# Patient Record
Sex: Female | Born: 1955 | Race: White | Hispanic: No | Marital: Married | Smoking: Former smoker
Health system: Southern US, Community
[De-identification: ages and names within clinical notes are randomized; demographics above are authoritative.]

## PROBLEM LIST (undated history)

## (undated) DIAGNOSIS — B019 Varicella without complication: Secondary | ICD-10-CM

## (undated) HISTORY — DX: Varicella without complication: B01.9

---

## 1997-10-02 ENCOUNTER — Inpatient Hospital Stay (HOSPITAL_COMMUNITY): Admission: AD | Admit: 1997-10-02 | Discharge: 1997-10-02 | Payer: Self-pay | Admitting: *Deleted

## 1997-10-03 ENCOUNTER — Inpatient Hospital Stay (HOSPITAL_COMMUNITY): Admission: AD | Admit: 1997-10-03 | Discharge: 1997-10-05 | Payer: Self-pay | Admitting: Obstetrics and Gynecology

## 1999-02-27 ENCOUNTER — Other Ambulatory Visit: Admission: RE | Admit: 1999-02-27 | Discharge: 1999-02-27 | Payer: Self-pay | Admitting: Obstetrics and Gynecology

## 2000-06-12 ENCOUNTER — Other Ambulatory Visit: Admission: RE | Admit: 2000-06-12 | Discharge: 2000-06-12 | Payer: Self-pay | Admitting: Obstetrics and Gynecology

## 2001-06-23 ENCOUNTER — Other Ambulatory Visit: Admission: RE | Admit: 2001-06-23 | Discharge: 2001-06-23 | Payer: Self-pay | Admitting: Obstetrics and Gynecology

## 2001-10-21 ENCOUNTER — Encounter: Payer: Self-pay | Admitting: Obstetrics and Gynecology

## 2001-10-21 ENCOUNTER — Encounter: Admission: RE | Admit: 2001-10-21 | Discharge: 2001-10-21 | Payer: Self-pay | Admitting: Obstetrics and Gynecology

## 2002-06-29 ENCOUNTER — Other Ambulatory Visit: Admission: RE | Admit: 2002-06-29 | Discharge: 2002-06-29 | Payer: Self-pay | Admitting: Obstetrics and Gynecology

## 2003-03-23 ENCOUNTER — Encounter: Admission: RE | Admit: 2003-03-23 | Discharge: 2003-03-23 | Payer: Self-pay | Admitting: Obstetrics and Gynecology

## 2003-03-23 ENCOUNTER — Encounter: Payer: Self-pay | Admitting: Obstetrics and Gynecology

## 2003-08-05 ENCOUNTER — Other Ambulatory Visit: Admission: RE | Admit: 2003-08-05 | Discharge: 2003-08-05 | Payer: Self-pay | Admitting: Obstetrics and Gynecology

## 2004-08-08 ENCOUNTER — Encounter: Admission: RE | Admit: 2004-08-08 | Discharge: 2004-08-08 | Payer: Self-pay | Admitting: Obstetrics and Gynecology

## 2005-09-26 ENCOUNTER — Encounter: Admission: RE | Admit: 2005-09-26 | Discharge: 2005-09-26 | Payer: Self-pay | Admitting: Obstetrics and Gynecology

## 2005-10-16 ENCOUNTER — Encounter: Admission: RE | Admit: 2005-10-16 | Discharge: 2005-10-16 | Payer: Self-pay | Admitting: Obstetrics and Gynecology

## 2006-10-10 ENCOUNTER — Encounter: Admission: RE | Admit: 2006-10-10 | Discharge: 2006-10-10 | Payer: Self-pay | Admitting: Obstetrics and Gynecology

## 2007-10-30 ENCOUNTER — Encounter: Admission: RE | Admit: 2007-10-30 | Discharge: 2007-10-30 | Payer: Self-pay | Admitting: Obstetrics and Gynecology

## 2008-11-03 ENCOUNTER — Encounter: Admission: RE | Admit: 2008-11-03 | Discharge: 2008-11-03 | Payer: Self-pay | Admitting: Obstetrics and Gynecology

## 2009-11-10 ENCOUNTER — Encounter: Admission: RE | Admit: 2009-11-10 | Discharge: 2009-11-10 | Payer: Self-pay | Admitting: Obstetrics and Gynecology

## 2010-04-21 ENCOUNTER — Encounter (INDEPENDENT_AMBULATORY_CARE_PROVIDER_SITE_OTHER): Payer: Self-pay

## 2010-04-25 ENCOUNTER — Ambulatory Visit: Payer: Self-pay | Admitting: Internal Medicine

## 2010-09-17 ENCOUNTER — Encounter: Payer: Self-pay | Admitting: Obstetrics and Gynecology

## 2010-09-26 NOTE — Miscellaneous (Signed)
Summary: Lec previsit  Clinical Lists Changes  Medications: Added new medication of MOVIPREP 100 GM  SOLR (PEG-KCL-NACL-NASULF-NA ASC-C) As per prep instructions. - Signed Rx of MOVIPREP 100 GM  SOLR (PEG-KCL-NACL-NASULF-NA ASC-C) As per prep instructions.;  #1 x 0;  Signed;  Entered by: Ulis Rias RN;  Authorized by: Hilarie Fredrickson MD;  Method used: Electronically to [facility]  [address] [phone]*, [address], [place], [place]  [address], Ph: [phone] or [phone], Fax: [phone] Observations: Added new observation of NKA: T (04/25/2010 16:15)    Prescriptions: MOVIPREP 100 GM  SOLR (PEG-KCL-NACL-NASULF-NA ASC-C) As per prep instructions.  #1 x 0   Entered by:   Ulis Rias RN   Authorized by:   Hilarie Fredrickson MD   Signed by:   Ulis Rias RN on 04/25/2010   Method used:   [facility]ronically to        [facility]  [phone]* (retail)       [address]       [place], [place]  [address]       Ph: [phone] or [phone]       Fax: [phone]   RxID:   [id number]

## 2010-09-26 NOTE — Letter (Signed)
Summary: [facility] Instructions  [facility]  [address] [place], [place] [address]   Phone: [phone]  Fax: [phone]       Jacqueline Huang    1955-12-09    MRN: [id number]        Procedure Day /Date:  Thursday 05/04/2010     Arrival Time: 9:30 am      Procedure Time: 10:30 am     Location of Procedure:                    _ x_  [facility] (4th Floor)                        PREPARATION FOR COLONOSCOPY WITH MOVIPREP   Starting 5 days prior to your procedure Saturday 9/3 do not eat nuts, seeds, popcorn, corn, beans, peas,  salads, or any raw vegetables.  Do not take any fiber supplements (e.g. Metamucil, Citrucel, and Benefiber).  THE DAY BEFORE YOUR PROCEDURE         DATE: Wednesday 9/7  1.  Drink clear liquids the entire day-NO SOLID FOOD  2.  Do not drink anything colored red or purple.  Avoid juices with pulp.  No orange juice.  3.  Drink at least 64 oz. (8 glasses) of fluid/clear liquids during the day to prevent dehydration and help the prep work efficiently.  CLEAR LIQUIDS INCLUDE: Water Jello Ice Popsicles Tea (sugar ok, no milk/cream) Powdered fruit flavored drinks Coffee (sugar ok, no milk/cream) Gatorade Juice: apple, white grape, white cranberry  Lemonade Clear bullion, consomm, broth Carbonated beverages (any kind) Strained chicken noodle soup Hard Candy                             4.  In the morning, mix first dose of MoviPrep solution:    Empty 1 Pouch A and 1 Pouch B into the disposable container    Add lukewarm drinking water to the top line of the container. Mix to dissolve    Refrigerate (mixed solution should be used within 24 hrs)  5.  Begin drinking the prep at 5:00 p.m. The MoviPrep container is divided by 4 marks.   Every 15 minutes drink the solution down to the next mark (approximately 8 oz) until the full liter is complete.   6.  Follow completed prep with 16 oz of clear liquid of your choice  (Nothing red or purple).  Continue to drink clear liquids until bedtime.  7.  Before going to bed, mix second dose of MoviPrep solution:    Empty 1 Pouch A and 1 Pouch B into the disposable container    Add lukewarm drinking water to the top line of the container. Mix to dissolve    Refrigerate  THE DAY OF YOUR PROCEDURE      DATE: Thursday 9/8  Beginning at 5:30 a.m. (5 hours before procedure):         1. Every 15 minutes, drink the solution down to the next mark (approx 8 oz) until the full liter is complete.  2. Follow completed prep with 16 oz. of clear liquid of your choice.    3. You may drink clear liquids until 8:30 am (2 HOURS BEFORE PROCEDURE).   MEDICATION INSTRUCTIONS  Unless otherwise instructed, you should take regular prescription medications with a small sip of water   as early as possible the morning of  your procedure.         OTHER INSTRUCTIONS  You will need a responsible adult at least 53 years of age to accompany you and drive you home.   This person must remain in the waiting room during your procedure.  Wear loose fitting clothing that is easily removed.  Leave jewelry and other valuables at home.  However, you may wish to bring a book to read or  an iPod/MP3 player to listen to music as you wait for your procedure to start.  Remove all body piercing jewelry and leave at home.  Total time from sign-in until discharge is approximately 2-3 hours.  You should go home directly after your procedure and rest.  You can resume normal activities the  day after your procedure.  The day of your procedure you should not:   Drive   Make legal decisions   Operate machinery   Drink alcohol   Return to work  You will receive specific instructions about eating, activities and medications before you leave.    The above instructions have been reviewed and explained to me by   Ulis Rias RN  April 25, 2010 5:08 PM     I fully understand and  can verbalize these instructions _____________________________ Date _________

## 2010-11-08 ENCOUNTER — Other Ambulatory Visit: Payer: Self-pay | Admitting: Obstetrics and Gynecology

## 2010-11-08 DIAGNOSIS — Z1231 Encounter for screening mammogram for malignant neoplasm of breast: Secondary | ICD-10-CM

## 2010-11-15 ENCOUNTER — Ambulatory Visit
Admission: RE | Admit: 2010-11-15 | Discharge: 2010-11-15 | Disposition: A | Discharge status: Home / Self Care | Payer: BC Managed Care – PPO | Source: Ambulatory Visit | Attending: Obstetrics and Gynecology | Admitting: Obstetrics and Gynecology

## 2010-11-15 DIAGNOSIS — Z1231 Encounter for screening mammogram for malignant neoplasm of breast: Secondary | ICD-10-CM

## 2011-11-13 ENCOUNTER — Other Ambulatory Visit: Payer: Self-pay | Admitting: Obstetrics and Gynecology

## 2011-11-13 DIAGNOSIS — Z1231 Encounter for screening mammogram for malignant neoplasm of breast: Secondary | ICD-10-CM

## 2011-11-20 ENCOUNTER — Ambulatory Visit
Admission: RE | Admit: 2011-11-20 | Discharge: 2011-11-20 | Disposition: A | Discharge status: Home / Self Care | Payer: BC Managed Care – PPO | Source: Ambulatory Visit | Attending: Obstetrics and Gynecology | Admitting: Obstetrics and Gynecology

## 2011-11-20 DIAGNOSIS — Z1231 Encounter for screening mammogram for malignant neoplasm of breast: Secondary | ICD-10-CM

## 2012-03-09 ENCOUNTER — Ambulatory Visit (INDEPENDENT_AMBULATORY_CARE_PROVIDER_SITE_OTHER): Payer: BC Managed Care – PPO | Admitting: Family Medicine

## 2012-03-09 VITALS — BP 118/78 | HR 80 | Temp 98.6°F | Resp 16 | Ht 68.5 in | Wt 178.8 lb

## 2012-03-09 DIAGNOSIS — B029 Zoster without complications: Secondary | ICD-10-CM

## 2012-03-09 MED ORDER — VALACYCLOVIR HCL 1 G PO TABS: 1000.0000 mg | ORAL_TABLET | Freq: Three times a day (TID) | ORAL | Status: DC

## 2012-03-09 MED ORDER — HYDROCODONE-ACETAMINOPHEN 5-500 MG PO TABS: 1.0000 | ORAL_TABLET | Freq: Three times a day (TID) | ORAL | Status: AC | PRN

## 2012-03-09 NOTE — Patient Instructions (Signed)
To look up more info on your condition, go to the website [email or web address], then on patient resources - select UPTODATE. Under patient resources, select Shingles Return to the clinic or go to the nearest emergency room if any of your symptoms worsen or new symptoms occur.    Shingles Shingles is caused by the same virus that causes chickenpox (varicella zoster virus or VZV). Shingles often occurs many years or decades after having chickenpox. That is why it is more common in adults older than 50 years. The virus reactivates and breaks out as an infection in a nerve root. SYMPTOMS   The initial feeling (sensations) may be pain. This pain is usually described as:   Burning.   Stabbing.   Throbbing.   Tingling in the nerve root.   A red rash will follow in a couple days. The rash may occur in any area of the body and is usually on one side (unilateral) of the body in a band or belt-like pattern. The rash usually starts out as very small blisters (vesicles). They will dry up after 7 to 10 days. This is not usually a significant problem except for the pain it causes.   Long-lasting (chronic) pain is more likely in an elderly person. It can last months to years. This condition is called postherpetic neuralgia.  Shingles can be an extremely severe infection in someone with AIDS, a weakened immune system, or with forms of leukemia. It can also be severe if you are taking transplant medicines or other medicines that weaken the immune system. TREATMENT  Your caregiver will often treat you with:  Antiviral drugs.   Anti-inflammatory drugs.   Pain medicines.  Bed rest is very important in preventing the pain associated with herpes zoster (postherpetic neuralgia). Application of heat in the form of a hot water bottle or electric heating pad or gentle pressure with the hand is recommended to help with the pain or discomfort. PREVENTION  A varicella zoster vaccine is available to help protect against  the virus. The Food and Drug Administration approved the varicella zoster vaccine for individuals 32 years of age and older. HOME CARE INSTRUCTIONS   Cool compresses to the area of rash may be helpful.   Only take over-the-counter or prescription medicines for pain, discomfort, or fever as directed by your caregiver.   Avoid contact with:   Babies.   Pregnant women.   Children with eczema.   Elderly people with transplants.   People with chronic illnesses, such as leukemia and AIDS.   If the area involved is on your face, you may receive a referral for follow-up to a specialist. It is very important to keep all follow-up appointments. This will help avoid eye complications, chronic pain, or disability.  SEEK IMMEDIATE MEDICAL CARE IF:   You develop any pain (headache) in the area of the face or eye. This must be followed carefully by your caregiver or ophthalmologist. An infection in part of your eye (cornea) can be very serious. It could lead to blindness.   You do not have pain relief from prescribed medicines.   Your redness or swelling spreads.   The area involved becomes very swollen and painful.   You have a fever.   You notice any red or painful lines extending away from the affected area toward your heart (lymphangitis).   Your condition is worsening or has changed.  Document Released: 08/13/2005 Document Revised: 08/02/2011 Document Reviewed: 07/18/2009 [facility] Patient Information 2012 [place], [place].

## 2012-03-09 NOTE — Progress Notes (Signed)
  Subjective:    Patient ID: REN GRASSE, female    DOB: 06/13/1956, 56 y.o.   MRN: [id number]  HPI ADA HOLNESS is a 56 y.o. female Rash - R back around side to breast.  Itchy, painful.  Soreness/ache - last week - but noticed a rash 2 nights ago.  Slight nausea with the pain.    Tx: calamine, advil.     Review of Systems As above.  No fever.  Rash only on R side/.     Objective:   Physical Exam  Constitutional: She is oriented to person, place, and time. She appears well-developed and well-nourished.  HENT:  Head: Normocephalic.  Pulmonary/Chest: Effort normal.  Neurological: She is alert and oriented to person, place, and time.  Skin: Skin is warm and dry. Rash noted. Rash is vesicular.          vesicular - coalescent erythematous rash in T4 distribution on R .   Psychiatric: She has a normal mood and affect. Her behavior is normal.      Assessment & Plan:  TEKOA HAMOR is a 56 y.o. female

## 2012-11-13 ENCOUNTER — Other Ambulatory Visit: Payer: Self-pay

## 2012-11-13 ENCOUNTER — Other Ambulatory Visit: Payer: Self-pay | Admitting: Obstetrics and Gynecology

## 2012-12-15 ENCOUNTER — Ambulatory Visit
Admission: RE | Admit: 2012-12-15 | Discharge: 2012-12-15 | Disposition: A | Discharge status: Home / Self Care | Payer: BC Managed Care – PPO | Source: Ambulatory Visit

## 2012-12-15 DIAGNOSIS — Z1231 Encounter for screening mammogram for malignant neoplasm of breast: Secondary | ICD-10-CM

## 2013-03-05 ENCOUNTER — Ambulatory Visit (INDEPENDENT_AMBULATORY_CARE_PROVIDER_SITE_OTHER): Payer: BC Managed Care – PPO | Admitting: Family Medicine

## 2013-03-05 ENCOUNTER — Encounter: Payer: Self-pay | Admitting: Family Medicine

## 2013-03-05 VITALS — BP 124/82 | HR 70 | Temp 97.9°F | Ht 68.0 in | Wt 167.0 lb

## 2013-03-05 DIAGNOSIS — L918 Other hypertrophic disorders of the skin: Secondary | ICD-10-CM

## 2013-03-05 DIAGNOSIS — Z23 Encounter for immunization: Secondary | ICD-10-CM

## 2013-03-05 DIAGNOSIS — L909 Atrophic disorder of skin, unspecified: Secondary | ICD-10-CM

## 2013-03-05 DIAGNOSIS — L821 Other seborrheic keratosis: Secondary | ICD-10-CM

## 2013-03-05 NOTE — Patient Instructions (Addendum)
Skin Cancer  Most skin cancers occur on the face, neck, arms, or on sun-exposed areas. Damage from excessive sunlight exposure is the most common cause of skin cancer. People with light skin are more likely to develop skin cancer than people with more skin pigment, but skin cancers occur in people of all races. Squamous and basal cell cancers are the most common types. They are both slow-growing and easy to recognize. They often form scaly open sores that do not heal. Melanoma is a skin cancer that looks like a mole. Most moles are harmless, especially if they do not change over time.  Most skin cancers can be prevented by avoiding excessive sunlight exposure. Signs that a skin sore or mole may be cancerous include:   A sore that does not heal or one that bleeds or slowly gets bigger.   Moles that are growing or are larger than a pencil eraser, or ones that have irregular borders or color variations.   Moles that have a bump, a raised area, or cause itching or pain.  If you have a skin sore or mole that could be cancer, it is important that you call your doctor right away for a skin exam and possible skin biopsy to determine the best treatment.  Document Released: 09/20/2004 Document Revised: 11/05/2011 Document Reviewed: 12/31/2008  [employer] Patient Information 2014 [employer].

## 2013-03-05 NOTE — Progress Notes (Signed)
  Subjective:    Patient ID: Jacqueline Huang, female    DOB: September 14, 1955, 57 y.o.   MRN: [id number]  HPI Here to establish care Been generally very healthy with no chronic medical problems. She sees gynecologist and is on hormone replacement post menopause. No prior surgeries. She did smoke several years ago but quit from 1998.  12 pack year history.  Family history significant for mother dying of melanoma complications. Patient is requesting skin check today. She has multiple seborrheic keratoses and skin tags but no recent changing or concerning lesions.  Last tetanus was 2004. She walks some for exercise.  Past Medical History  Diagnosis Date  . Chicken pox    History reviewed. No pertinent past surgical history.  reports that she has quit smoking. She does not have any smokeless tobacco history on file. Her alcohol and drug histories are not on file. family history includes Cancer in her mother. No Known Allergies     Review of Systems  Constitutional: Negative for fever, activity change, appetite change, fatigue and unexpected weight change.  HENT: Negative for hearing loss, ear pain, sore throat and trouble swallowing.   Eyes: Negative for visual disturbance.  Respiratory: Negative for cough and shortness of breath.   Cardiovascular: Negative for chest pain and palpitations.  Gastrointestinal: Negative for abdominal pain, diarrhea, constipation and blood in stool.  Genitourinary: Negative for dysuria and hematuria.  Musculoskeletal: Negative for myalgias, back pain and arthralgias.  Skin: Negative for rash.  Neurological: Negative for dizziness, syncope and headaches.  Hematological: Negative for adenopathy.  Psychiatric/Behavioral: Negative for confusion and dysphoric mood.       Objective:   Physical Exam  Constitutional: She appears well-developed and well-nourished.  Neck: Neck supple. No thyromegaly present.  Cardiovascular: Normal rate and regular rhythm.    No murmur heard. Pulmonary/Chest: Breath sounds normal. No respiratory distress. She has no wheezes. She has no rales.  Musculoskeletal: She exhibits no edema.  Skin:  Patient's combination of seborrheic keratoses and skin tags mostly involving the trunk. No concerning lesions noted          Assessment & Plan:  Patient has multiple irritated skin tags because of location and seborrheic keratoses. She is reassured these all appear benign. Discussed risk and benefits of treatment with liquid nitrogen and patient consents . We treated four different skin tags left side of neck and right side of neck and four skin tags anterior chest that were irritated because of location.

## 2013-07-02 ENCOUNTER — Other Ambulatory Visit: Payer: Self-pay

## 2013-12-10 ENCOUNTER — Other Ambulatory Visit: Payer: Self-pay

## 2013-12-10 DIAGNOSIS — Z1231 Encounter for screening mammogram for malignant neoplasm of breast: Secondary | ICD-10-CM

## 2013-12-17 ENCOUNTER — Ambulatory Visit (INDEPENDENT_AMBULATORY_CARE_PROVIDER_SITE_OTHER): Payer: BC Managed Care – PPO | Admitting: Family Medicine

## 2013-12-17 ENCOUNTER — Encounter: Payer: Self-pay | Admitting: Family Medicine

## 2013-12-17 ENCOUNTER — Ambulatory Visit
Admission: RE | Admit: 2013-12-17 | Discharge: 2013-12-17 | Disposition: A | Discharge status: Home / Self Care | Payer: BC Managed Care – PPO | Source: Ambulatory Visit

## 2013-12-17 VITALS — BP 130/80 | HR 79 | Temp 97.8°F | Wt 168.0 lb

## 2013-12-17 DIAGNOSIS — K602 Anal fissure, unspecified: Secondary | ICD-10-CM

## 2013-12-17 DIAGNOSIS — Z1231 Encounter for screening mammogram for malignant neoplasm of breast: Secondary | ICD-10-CM

## 2013-12-17 MED ORDER — LORAZEPAM 0.5 MG PO TABS: 0.5000 mg | ORAL_TABLET | Freq: Four times a day (QID) | ORAL | Status: DC | PRN

## 2013-12-17 NOTE — Progress Notes (Addendum)
   Subjective:    Patient ID: Jacqueline PlaterCynthia L Smelser, female    DOB: 10/20/1955, 58 y.o.   MRN: [id number]  HPI Patient seen with concern for possible hemorrhoids. For about 3 weeks she noticed some itching and mild soreness around the anal area. She has not had any change in bowel habits. No blood with wiping. Symptoms of been relatively mild. Has not tried any alleviating factors. She reports colonoscopy 3 years ago normal. No recent appetite or weight changes. No exacerbating factors.  Past Medical History  Diagnosis Date  . Chicken pox    No past surgical history on file.  reports that she has quit smoking. She does not have any smokeless tobacco history on file. Her alcohol and drug histories are not on file. family history includes Cancer in her mother. No Known Allergies    Review of Systems  Constitutional: Negative for appetite change and unexpected weight change.  Gastrointestinal: Negative for vomiting, abdominal pain, diarrhea, constipation and blood in stool.       Objective:   Physical Exam  Constitutional: She appears well-developed and well-nourished.  Cardiovascular: Normal rate and regular rhythm.   Pulmonary/Chest: Effort normal and breath sounds normal. No respiratory distress. She has no wheezes. She has no rales.  Genitourinary:  Patient has anal fissure at 12:00 position. No active bleeding. She does not have any visible external hemorrhoids, nor any external rash or dermatitis.          Assessment & Plan:  Anal fissure. No active bleeding. Minimal discomfort. Recommend sitz baths. Consider either topical nitroglycerin diluted with Vaseline or diltiazem diluted with Vaseline if her pain becomes more prominent. Measures to reduce constipation  Patient requested something for flying. [place] this [place]. wrote limited lorazepam 0.5 mg one every 6 hours as needed for severe anxiety #20 with no refill

## 2013-12-17 NOTE — Patient Instructions (Signed)
Anal Fissure, Adult  An anal fissure is a small tear or crack in the skin around the anus. Bleeding from a fissure usually stops on its own within a few minutes. However, bleeding will often reoccur with each bowel movement until the crack heals.   CAUSES    Passing large, hard stools.   Frequent diarrheal stools.   Constipation.   Inflammatory bowel disease (Crohn's disease or ulcerative colitis).   Infections.   Anal sex.  SYMPTOMS    Small amounts of blood seen on your stools, on toilet paper, or in the toilet after a bowel movement.   Rectal bleeding.   Painful bowel movements.   Itching or irritation around the anus.  DIAGNOSIS  Your caregiver will examine the anal area. An anal fissure can usually be seen with careful inspection. A rectal exam may be performed and a short tube (anoscope) may be used to examine the anal canal.  TREATMENT    You may be instructed to take fiber supplements. These supplements can soften your stool to help make bowel movements easier.   Sitz baths may be recommended to help heal the tear. Do not use soap in the sitz baths.   A medicated cream or ointment may be prescribed to lessen discomfort.  HOME CARE INSTRUCTIONS    Maintain a diet high in fruits, whole grains, and vegetables. Avoid constipating foods like bananas and dairy products.   Take sitz baths as directed by your caregiver.   Drink enough fluids to keep your urine clear or pale yellow.   Only take over-the-counter or prescription medicines for pain, discomfort, or fever as directed by your caregiver. Do not take aspirin as this may increase bleeding.   Do not use ointments containing numbing medications (anesthetics) or hydrocortisone. They could slow healing.  SEEK MEDICAL CARE IF:    Your fissure is not completely healed within 3 days.   You have further bleeding.   You have a fever.   You have diarrhea mixed with blood.   You have pain.   Your problem is getting worse rather than  better.  MAKE SURE YOU:    Understand these instructions.   Will watch your condition.   Will get help right away if you are not doing well or get worse.  Document Released: 08/13/2005 Document Revised: 11/05/2011 Document Reviewed: 01/28/2011  [employer] Patient Information 2014 [employer].

## 2013-12-17 NOTE — Addendum Note (Signed)
Addended by: Kristian CoveyBURCHETTE, Welcome Fults W on: 12/17/2013 10:25 AM   Modules accepted: Orders

## 2013-12-17 NOTE — Progress Notes (Signed)
Pre visit review using our clinic review tool, if applicable. No additional management support is needed unless otherwise documented below in the visit note. 

## 2014-06-11 ENCOUNTER — Other Ambulatory Visit: Payer: Self-pay

## 2014-11-09 ENCOUNTER — Encounter: Payer: Self-pay | Admitting: Family Medicine

## 2014-11-09 ENCOUNTER — Ambulatory Visit (INDEPENDENT_AMBULATORY_CARE_PROVIDER_SITE_OTHER): Payer: BLUE CROSS/BLUE SHIELD | Admitting: Family Medicine

## 2014-11-09 VITALS — BP 124/80 | HR 70 | Temp 97.7°F | Wt 175.0 lb

## 2014-11-09 DIAGNOSIS — L601 Onycholysis: Secondary | ICD-10-CM

## 2014-11-09 NOTE — Progress Notes (Signed)
Pre visit review using our clinic review tool, if applicable. No additional management support is needed unless otherwise documented below in the visit note. 

## 2014-11-09 NOTE — Patient Instructions (Signed)
Onycholysis- nail separation vs onychomycosis Keep foot and area around nail as dry as possible.

## 2014-11-09 NOTE — Progress Notes (Signed)
   Subjective:    Patient ID: Jacqueline Huang, female    DOB: 05/18/1956, 59 y.o.   MRN: [id number]  HPI   Patient seen with nail changes both feet involving great toenails. She's noticed some separation of the nail from the tissue underneath. She's noticed some color changes of both toenails. No pain. No thickened or brittle changes. No hx of diabetes.    Past Medical History  Diagnosis Date  . Chicken pox    No past surgical history on file.  reports that she has quit smoking. She does not have any smokeless tobacco history on file. Her alcohol and drug histories are not on file. family history includes Cancer in her mother. No Known Allergies    Review of Systems  Constitutional: Negative for fever and chills.  Skin: Negative for rash.       Objective:   Physical Exam  Constitutional: She appears well-developed and well-nourished.  Cardiovascular: Normal rate and regular rhythm.   Skin:  Patient has some separation of both great toenails from tissue underneath. Nail itself is non-brittle and appears solid. No surrounding erythema. 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 Onychomycosis less likely. Set up podiatry referral. Keep areas dry as possible.  We discussed risks of superinfection with things like pseudomonas.

## 2014-11-17 ENCOUNTER — Ambulatory Visit (INDEPENDENT_AMBULATORY_CARE_PROVIDER_SITE_OTHER): Payer: BLUE CROSS/BLUE SHIELD | Admitting: Podiatry

## 2014-11-17 ENCOUNTER — Encounter: Payer: Self-pay | Admitting: Podiatry

## 2014-11-17 VITALS — BP 137/87 | HR 93 | Ht 69.0 in | Wt 170.0 lb

## 2014-11-17 DIAGNOSIS — M21969 Unspecified acquired deformity of unspecified lower leg: Secondary | ICD-10-CM | POA: Diagnosis not present

## 2014-11-17 DIAGNOSIS — M2020 Hallux rigidus, unspecified foot: Secondary | ICD-10-CM

## 2014-11-17 DIAGNOSIS — M205X9 Other deformities of toe(s) (acquired), unspecified foot: Secondary | ICD-10-CM

## 2014-11-17 DIAGNOSIS — L609 Nail disorder, unspecified: Secondary | ICD-10-CM

## 2014-11-17 DIAGNOSIS — L608 Other nail disorders: Secondary | ICD-10-CM

## 2014-11-17 NOTE — Progress Notes (Signed)
Subjective: 59 year old female presents stating that both great toe nails have discoloration x 1 year. Last few weeks got worse.  Not on feet much. Has desk job. Does elliptical exercise.   Review of systems are negative.  Objective: Dermatologic: Dark brown discolored toe nails on both great toes. Neurovascular status are within normal. 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 Dorsally enlarged first Metatarsophalangeal joint left. Limited dorsiflexion of the 1st MPJ, 20 degrees on left and 35 degrees on right.   Assessment: Hallux limitus left>right.  Microtrauma to great toe nails bilateral. Metatarsal deformity bilateral.   Plan: Reviewed clinical findings. Reviewed the need for change in shoe gear and custom orthotics.

## 2014-11-17 NOTE — Patient Instructions (Signed)
Seen for discolorated toe nails. Noted of displacing first metatarsal bones, leading to "Hallux limitus" condition and micro trauma to toe nails. Recommend soft toe box shoes and custom orthotics in future.

## 2015-07-14 ENCOUNTER — Encounter (HOSPITAL_BASED_OUTPATIENT_CLINIC_OR_DEPARTMENT_OTHER): Payer: Self-pay | Admitting: *Deleted

## 2015-07-14 ENCOUNTER — Emergency Department (HOSPITAL_BASED_OUTPATIENT_CLINIC_OR_DEPARTMENT_OTHER): Payer: BLUE CROSS/BLUE SHIELD

## 2015-07-14 ENCOUNTER — Emergency Department (HOSPITAL_BASED_OUTPATIENT_CLINIC_OR_DEPARTMENT_OTHER)
Admission: EM | Admit: 2015-07-14 | Discharge: 2015-07-14 | Disposition: A | Discharge status: Home / Self Care | Payer: BLUE CROSS/BLUE SHIELD | Attending: Emergency Medicine | Admitting: Emergency Medicine

## 2015-07-14 DIAGNOSIS — S93402A Sprain of unspecified ligament of left ankle, initial encounter: Secondary | ICD-10-CM | POA: Insufficient documentation

## 2015-07-14 DIAGNOSIS — Y998 Other external cause status: Secondary | ICD-10-CM | POA: Insufficient documentation

## 2015-07-14 DIAGNOSIS — S99912A Unspecified injury of left ankle, initial encounter: Secondary | ICD-10-CM | POA: Diagnosis present

## 2015-07-14 DIAGNOSIS — Z79818 Long term (current) use of other agents affecting estrogen receptors and estrogen levels: Secondary | ICD-10-CM | POA: Insufficient documentation

## 2015-07-14 DIAGNOSIS — Z87891 Personal history of nicotine dependence: Secondary | ICD-10-CM | POA: Diagnosis not present

## 2015-07-14 DIAGNOSIS — Y9289 Other specified places as the place of occurrence of the external cause: Secondary | ICD-10-CM | POA: Insufficient documentation

## 2015-07-14 DIAGNOSIS — Z8619 Personal history of other infectious and parasitic diseases: Secondary | ICD-10-CM | POA: Diagnosis not present

## 2015-07-14 DIAGNOSIS — W5512XA Struck by horse, initial encounter: Secondary | ICD-10-CM | POA: Diagnosis not present

## 2015-07-14 DIAGNOSIS — Y9389 Activity, other specified: Secondary | ICD-10-CM | POA: Insufficient documentation

## 2015-07-14 MED ORDER — DICLOFENAC SODIUM 1 % TD GEL: 2.0000 g | Freq: Four times a day (QID) | TRANSDERMAL | Status: DC

## 2015-07-14 MED ORDER — IBUPROFEN 600 MG PO TABS: 600.0000 mg | ORAL_TABLET | Freq: Four times a day (QID) | ORAL | Status: DC | PRN

## 2015-07-14 NOTE — Discharge Instructions (Signed)
Ankle Sprain  An ankle sprain is an injury to the strong, fibrous tissues (ligaments) that hold the bones of your ankle joint together.   CAUSES  An ankle sprain is usually caused by a fall or by twisting your ankle. Ankle sprains most commonly occur when you step on the outer edge of your foot, and your ankle turns inward. People who participate in sports are more prone to these types of injuries.   SYMPTOMS    Pain in your ankle. The pain may be present at rest or only when you are trying to stand or walk.   Swelling.   Bruising. Bruising may develop immediately or within 1 to 2 days after your injury.   Difficulty standing or walking, particularly when turning corners or changing directions.  DIAGNOSIS   Your caregiver will ask you details about your injury and perform a physical exam of your ankle to determine if you have an ankle sprain. During the physical exam, your caregiver will press on and apply pressure to specific areas of your foot and ankle. Your caregiver will try to move your ankle in certain ways. An X-ray exam may be done to be sure a bone was not broken or a ligament did not separate from one of the bones in your ankle (avulsion fracture).   TREATMENT   Certain types of braces can help stabilize your ankle. Your caregiver can make a recommendation for this. Your caregiver may recommend the use of medicine for pain. If your sprain is severe, your caregiver may refer you to a surgeon who helps to restore function to parts of your skeletal system (orthopedist) or a physical therapist.  HOME CARE INSTRUCTIONS    Apply ice to your injury for 1-2 days or as directed by your caregiver. Applying ice helps to reduce inflammation and pain.    Put ice in a plastic bag.    Place a towel between your skin and the bag.    Leave the ice on for 15-20 minutes at a time, every 2 hours while you are awake.   Only take over-the-counter or prescription medicines for pain, discomfort, or fever as directed by  your caregiver.   Elevate your injured ankle above the level of your heart as much as possible for 2-3 days.   If your caregiver recommends crutches, use them as instructed. Gradually put weight on the affected ankle. Continue to use crutches or a cane until you can walk without feeling pain in your ankle.   If you have a plaster splint, wear the splint as directed by your caregiver. Do not rest it on anything harder than a pillow for the first 24 hours. Do not put weight on it. Do not get it wet. You may take it off to take a shower or bath.   You may have been given an elastic bandage to wear around your ankle to provide support. If the elastic bandage is too tight (you have numbness or tingling in your foot or your foot becomes cold and blue), adjust the bandage to make it comfortable.   If you have an air splint, you may blow more air into it or let air out to make it more comfortable. You may take your splint off at night and before taking a shower or bath. Wiggle your toes in the splint several times per day to decrease swelling.  SEEK MEDICAL CARE IF:    You have rapidly increasing bruising or swelling.   Your toes feel   extremely cold or you lose feeling in your foot.   Your pain is not relieved with medicine.  SEEK IMMEDIATE MEDICAL CARE IF:   Your toes are numb or blue.   You have severe pain that is increasing.  MAKE SURE YOU:    Understand these instructions.   Will watch your condition.   Will get help right away if you are not doing well or get worse.     This information is not intended to replace advice given to you by your health care provider. Make sure you discuss any questions you have with your health care provider.     Document Released: 08/13/2005 Document Revised: 09/03/2014 Document Reviewed: 08/25/2011  Elsevier Interactive Patient Education 2016 [employer]

## 2015-07-14 NOTE — ED Notes (Signed)
Left ankle swelling. She had a minor kick by a horse as she was leading it out for a walk.

## 2015-07-14 NOTE — ED Provider Notes (Signed)
CSN: [id number]     Arrival date & time 07/14/15  1634 History   First MD Initiated Contact with Patient 07/14/15 1648     Chief Complaint  Patient presents with  . Ankle Injury     (Consider location/radiation/quality/duration/timing/severity/associated sxs/prior Treatment) HPI Comments: Kicked by horse while leading it, could walk after incident and rode horse. Began having swelling of lateral malleolus and pain worsening, now can't bear weight without discomfort.   Patient is a 59 y.o. female presenting with lower extremity injury. The history is provided by the patient.  Ankle Injury This is a new problem. The current episode started 3 to 5 hours ago. The problem occurs constantly. The problem has not changed since onset.The symptoms are aggravated by walking. Nothing relieves the symptoms. She has tried a cold compress (NSAIDs) for the symptoms. The treatment provided mild relief.    Past Medical History  Diagnosis Date  . Chicken pox    History reviewed. No pertinent past surgical history. Family History  Problem Relation Age of Onset  . Cancer Mother     melonma    Social History  Substance Use Topics  . Smoking status: Former Games developermoker  . Smokeless tobacco: None  . Alcohol Use: None   OB History    No data available     Review of Systems  All other systems reviewed and are negative.     Allergies  Review of patient's allergies indicates no known allergies.  Home Medications   Prior to Admission medications   Medication Sig Start Date End Date Taking? Authorizing Provider  LORazepam (ATIVAN) 0.5 MG tablet Take 1 tablet (0.5 mg total) by mouth every 6 (six) hours as needed for anxiety. 12/17/13   Kristian CoveyBruce W Burchette, MD  norethindrone-ethinyl estradiol (FEMHRT 1/5) 1-5 MG-MCG TABS Take by mouth daily.    Historical Provider, MD   BP 135/93 mmHg  Pulse 74  Temp(Src) 97.9 F (36.6 C) (Oral)  Resp 18  Ht 5\' 9"  (1.753 m)  Wt 170 lb (77.111 kg)  BMI 25.09 kg/m2   SpO2 100% Physical Exam  Constitutional: She is oriented to person, place, and time. She appears well-developed and well-nourished. No distress.  HENT:  Head: Normocephalic.  Eyes: Conjunctivae are normal.  Neck: Neck supple. No tracheal deviation present.  Cardiovascular: Normal rate and regular rhythm.   Pulmonary/Chest: Effort normal. No respiratory distress.  Abdominal: Soft. She exhibits no distension.  Musculoskeletal:       Left ankle: She exhibits decreased range of motion (2/2 pain) and swelling. She exhibits no ecchymosis, no deformity, no laceration and normal pulse. Tenderness. AITFL tenderness found. No lateral malleolus, no medial malleolus and no posterior TFL tenderness found. Achilles tendon normal.  Neurological: She is alert and oriented to person, place, and time.  Skin: Skin is warm and dry.  Psychiatric: She has a normal mood and affect.    ED Course  Procedures (including critical care time) Labs Review Labs Reviewed - No data to display  Imaging Review Dg Ankle Complete Left  07/14/2015  CLINICAL DATA:  Left ankle swelling, kicked by a horse in the ankle today EXAM: LEFT ANKLE COMPLETE - 3+ VIEW COMPARISON:  None. FINDINGS: Three views of left ankle submitted. No acute fracture or subluxation. Ankle mortise is preserved. Mild lateral soft tissue swelling. IMPRESSION: No acute fracture or subluxation. Mild soft tissue swelling adjacent to lateral malleolus Electronically Signed   By: Natasha MeadLiviu  Pop M.D.   On: 07/14/2015 16:55   I  have personally reviewed and evaluated these images and lab results as part of my medical decision-making.   EKG Interpretation None      MDM   Final diagnoses:  Ankle sprain, left, initial encounter    59 year old female presents with lateral left ankle swelling after being struck by a horse with point tenderness over the ATFL and pain worst with inversion of ankle but with all cardinal directions. Suspect contusion with negative  plain film but possibility of ankle sprain is high given the exacerbating factors of her pain. She has no instability of the ankle joint with confrontation. She is unable to bear weight on it due to pain currently. Given crutches and topical NSAIDs. Pt given instructions for supportive care including NSAIDs, rest, ice, compression, and elevation to help alleviate symptoms. Plan to follow up with PCP as needed and return precautions discussed for worsening or new concerning symptoms.     Lyndal Pulley, MD 07/14/15 Rickey Primus

## 2017-05-16 ENCOUNTER — Encounter: Payer: Self-pay | Admitting: Family Medicine

## 2017-08-07 LAB — HM MAMMOGRAPHY

## 2017-08-29 ENCOUNTER — Encounter: Payer: Self-pay | Admitting: Family Medicine

## 2018-02-12 DIAGNOSIS — H2513 Age-related nuclear cataract, bilateral: Secondary | ICD-10-CM | POA: Diagnosis not present

## 2018-02-12 DIAGNOSIS — H52223 Regular astigmatism, bilateral: Secondary | ICD-10-CM | POA: Diagnosis not present

## 2018-02-12 DIAGNOSIS — H5213 Myopia, bilateral: Secondary | ICD-10-CM | POA: Diagnosis not present

## 2018-08-11 DIAGNOSIS — H2513 Age-related nuclear cataract, bilateral: Secondary | ICD-10-CM | POA: Diagnosis not present

## 2018-09-22 DIAGNOSIS — Z01419 Encounter for gynecological examination (general) (routine) without abnormal findings: Secondary | ICD-10-CM | POA: Diagnosis not present

## 2018-09-22 DIAGNOSIS — Z1231 Encounter for screening mammogram for malignant neoplasm of breast: Secondary | ICD-10-CM | POA: Diagnosis not present

## 2018-09-22 DIAGNOSIS — Z6827 Body mass index (BMI) 27.0-27.9, adult: Secondary | ICD-10-CM | POA: Diagnosis not present

## 2018-10-10 ENCOUNTER — Encounter: Payer: Self-pay | Admitting: Family Medicine

## 2018-10-10 ENCOUNTER — Ambulatory Visit (INDEPENDENT_AMBULATORY_CARE_PROVIDER_SITE_OTHER): Payer: 59 | Admitting: Family Medicine

## 2018-10-10 VITALS — BP 110/72 | HR 77 | Temp 97.7°F | Ht 69.0 in | Wt 182.2 lb

## 2018-10-10 DIAGNOSIS — Z808 Family history of malignant neoplasm of other organs or systems: Secondary | ICD-10-CM

## 2018-10-10 DIAGNOSIS — H269 Unspecified cataract: Secondary | ICD-10-CM

## 2018-10-10 DIAGNOSIS — Z13220 Encounter for screening for lipoid disorders: Secondary | ICD-10-CM | POA: Diagnosis not present

## 2018-10-10 LAB — LIPID PANEL
CHOL/HDL RATIO: 3
Cholesterol: 190 mg/dL (ref 0–200)
HDL: 75.6 mg/dL (ref >39.00)
LDL Cholesterol: 91 mg/dL (ref 0–99)
NONHDL: 114.38
TRIGLYCERIDES: 115 mg/dL (ref 0.0–149.0)
VLDL: 23 mg/dL (ref 0.0–40.0)

## 2018-10-10 NOTE — Progress Notes (Signed)
  Subjective:     Patient ID: Jacqueline Huang, female   DOB: Mar 24, 1956, 63 y.o.   MRN: [id number]  HPI Seen for the following issues today.  She has not been seen here in over 3 years  Recent diagnosis of bilateral cataracts per her optometrist.  She went to ophthalmologist but was told she had to have referral.  She is wanting to get surgery for her cataracts.  She is seen by GI gynecologist yearly.  She is requesting fasting lipids.  No family history of premature CAD.  Her only medication is hormone replacement which she gets through gynecologist.  Her father died age 18 of unknown etiology.  Mom died 60 of melanoma complications.  Past Medical History:  Diagnosis Date  . Chicken pox    History reviewed. No pertinent surgical history.  reports that she has quit smoking. She has never used smokeless tobacco. No history on file for alcohol and drug. family history includes Cancer in her mother. No Known Allergies   Review of Systems  Constitutional: Negative for fatigue.  Eyes: Negative for visual disturbance.  Respiratory: Negative for cough, chest tightness, shortness of breath and wheezing.   Cardiovascular: Negative for chest pain, palpitations and leg swelling.  Neurological: Negative for dizziness, seizures, syncope, weakness, light-headedness and headaches.       Objective:   Physical Exam Constitutional:      Appearance: Normal appearance.  Eyes:     Comments: Bilateral cataracts  Cardiovascular:     Rate and Rhythm: Normal rate and regular rhythm.  Pulmonary:     Effort: Pulmonary effort is normal.     Breath sounds: Normal breath sounds.  Skin:    Comments: She has multiple skin tags and also several scattered seborrheic keratoses.  No concerning skin lesions  Neurological:     Mental Status: She is alert.        Assessment:     #1 bilateral cataracts.  Patient needs referral to ophthalmologist for further evaluation  #2+ family history of  melanoma  #3 request for fasting lipid panel    Plan:     -Check fasting lipids -Ophthalmology referral placed -consider CPE this year.  Kristian Covey MD [facility]]

## 2018-10-10 NOTE — Patient Instructions (Signed)
We have placed referral to Dr Vonna Kotyk.  Will call regarding lipid results.

## 2018-10-12 ENCOUNTER — Encounter: Payer: Self-pay | Admitting: Family Medicine

## 2018-10-23 DIAGNOSIS — H25043 Posterior subcapsular polar age-related cataract, bilateral: Secondary | ICD-10-CM | POA: Diagnosis not present

## 2018-10-23 DIAGNOSIS — H5213 Myopia, bilateral: Secondary | ICD-10-CM | POA: Diagnosis not present

## 2018-10-23 DIAGNOSIS — H2512 Age-related nuclear cataract, left eye: Secondary | ICD-10-CM | POA: Diagnosis not present

## 2018-10-23 DIAGNOSIS — H2513 Age-related nuclear cataract, bilateral: Secondary | ICD-10-CM | POA: Diagnosis not present

## 2019-05-21 ENCOUNTER — Telehealth: Payer: Self-pay

## 2019-05-21 DIAGNOSIS — H269 Unspecified cataract: Secondary | ICD-10-CM

## 2019-05-21 NOTE — Telephone Encounter (Signed)
Copied from [place] [phone]. Topic: Referral - Request for Referral >> May 21, 2019  1:05 PM Lennox Solders wrote: Has patient seen PCP for this complaint yes. Pt needs a referral to see dr Deloria Lair for macular hole in right eye. Pt was referred by dr Tommy Rainwater after cataract surgery. Dr Zadie Rhine [phone]. Pt has [facility] and needs a referral from dr burchette >> May 21, 2019  2:09 PM Reyne Dumas L wrote: Pt called and states that she has an appointment on Monday at 3:15, needs referral completed before then.

## 2019-05-22 NOTE — Telephone Encounter (Signed)
OK for referral?  

## 2019-05-22 NOTE — Telephone Encounter (Signed)
Referral OK

## 2019-05-25 NOTE — Telephone Encounter (Signed)
Please advise. I do not see a referral in the patients chart.

## 2019-05-25 NOTE — Telephone Encounter (Signed)
I went ahead and placed order for ophthalmology referral.  I had verbally approved this to be done by my regular nurse several days ago

## 2019-05-25 NOTE — Telephone Encounter (Signed)
Kierra from Dr. Dahlia Bailiff office called to check the status of the referral for patient.  Patient's appt. Is today at 3:15 and they still have not received the referral.  Please advise and send referral as soon as possible.  CB# [phone]

## 2019-05-26 NOTE — Telephone Encounter (Signed)
Patient is aware. Nothing further needed.  

## 2019-11-12 LAB — HM MAMMOGRAPHY

## 2019-11-16 ENCOUNTER — Encounter: Payer: Self-pay | Admitting: Family Medicine

## 2019-11-19 ENCOUNTER — Ambulatory Visit: Payer: 59 | Attending: Internal Medicine

## 2019-11-19 DIAGNOSIS — Z23 Encounter for immunization: Secondary | ICD-10-CM

## 2019-11-19 NOTE — Progress Notes (Signed)
   Covid-19 Vaccination Clinic  Name:  KACEY DYSERT    MRN: [id number] DOB: [birth year]  11/19/2019  Ms. Zurn was observed post Covid-19 immunization for 15 minutes without incident. She was provided with Vaccine Information Sheet and instruction to access the V-Safe system.   Ms. Menendez was instructed to call 911 with any severe reactions post vaccine: Marland Kitchen Difficulty breathing  . Swelling of face and throat  . A fast heartbeat  . A bad rash all over body  . Dizziness and weakness   Immunizations Administered    Name Date Dose VIS Date Route   Pfizer COVID-19 Vaccine 11/19/2019 10:30 AM 0.3 mL 08/07/2019 Intramuscular   Manufacturer: [employer], [employer]   Lot: [id number]   NDC: [address]-1

## 2019-12-15 ENCOUNTER — Ambulatory Visit: Payer: 59 | Attending: Internal Medicine

## 2019-12-15 DIAGNOSIS — Z23 Encounter for immunization: Secondary | ICD-10-CM

## 2019-12-15 NOTE — Progress Notes (Signed)
   Covid-19 Vaccination Clinic  Name:  EBELIN DILLEHAY    MRN: [id number] DOB: June 11, 1956  12/15/2019  Ms. Gouger was observed post Covid-19 immunization for 15 minutes without incident. She was provided with Vaccine Information Sheet and instruction to access the V-Safe system.   Ms. Coonrod was instructed to call 911 with any severe reactions post vaccine: Marland Kitchen Difficulty breathing  . Swelling of face and throat  . A fast heartbeat  . A bad rash all over body  . Dizziness and weakness   Immunizations Administered    Name Date Dose VIS Date Route   Pfizer COVID-19 Vaccine 12/15/2019  8:08 AM 0.3 mL 10/21/2018 Intramuscular   Manufacturer: [employer], [employer]   Lot: [id number]   NDC: [address]-8

## 2020-02-02 ENCOUNTER — Ambulatory Visit (INDEPENDENT_AMBULATORY_CARE_PROVIDER_SITE_OTHER): Payer: 59 | Admitting: Ophthalmology

## 2020-02-02 ENCOUNTER — Other Ambulatory Visit: Payer: Self-pay

## 2020-02-02 ENCOUNTER — Encounter (INDEPENDENT_AMBULATORY_CARE_PROVIDER_SITE_OTHER): Payer: Self-pay | Admitting: Ophthalmology

## 2020-02-02 DIAGNOSIS — H43812 Vitreous degeneration, left eye: Secondary | ICD-10-CM | POA: Diagnosis not present

## 2020-02-02 DIAGNOSIS — Z09 Encounter for follow-up examination after completed treatment for conditions other than malignant neoplasm: Secondary | ICD-10-CM | POA: Insufficient documentation

## 2020-02-02 DIAGNOSIS — H26493 Other secondary cataract, bilateral: Secondary | ICD-10-CM

## 2020-02-02 DIAGNOSIS — Z9889 Other specified postprocedural states: Secondary | ICD-10-CM | POA: Diagnosis not present

## 2020-02-02 DIAGNOSIS — H35341 Macular cyst, hole, or pseudohole, right eye: Secondary | ICD-10-CM

## 2020-02-02 NOTE — Progress Notes (Signed)
02/02/2020     CHIEF COMPLAINT Patient presents for Retina Follow Up   HISTORY OF PRESENT ILLNESS: Jacqueline Huang is a 64 y.o. female who presents to the clinic today for:   HPI    Retina Follow Up    Patient presents with  Other (VIT ILM PEEL).  In right eye.  Severity is moderate.  Duration of 6 months.  Since onset it is stable.  I, the attending physician,  performed the HPI with the patient and updated documentation appropriately.          Comments    6 Month f\u OU. F\u after VIT ILM PEEL OD. OCT  Pt feels vision has slightly improved. Denies any complaints.       Last edited by Elyse Jarvis on 02/02/2020  9:10 AM. (History)      Referring physician: Kristian Covey, MD [address] [place],  [place] [address]  HISTORICAL INFORMATION:   Selected notes from the MEDICAL RECORD NUMBER       CURRENT MEDICATIONS: No current outpatient medications on file. (Ophthalmic Drugs)   No current facility-administered medications for this visit. (Ophthalmic Drugs)   Current Outpatient Medications (Other)  Medication Sig  . norethindrone-ethinyl estradiol (FEMHRT 1/5) 1-5 MG-MCG TABS Take by mouth daily.   No current facility-administered medications for this visit. (Other)      REVIEW OF SYSTEMS:    ALLERGIES No Known Allergies  PAST MEDICAL HISTORY Past Medical History:  Diagnosis Date  . Chicken pox    History reviewed. No pertinent surgical history.  FAMILY HISTORY Family History  Problem Relation Age of Onset  . Cancer Mother        melonma     SOCIAL HISTORY Social History   Tobacco Use  . Smoking status: Former Games developer  . Smokeless tobacco: Never Used  Substance Use Topics  . Alcohol use: Not on file  . Drug use: Not on file         OPHTHALMIC EXAM: Base Eye Exam    Visual Acuity (Snellen - Linear)      Right Left   Dist [place] 20/50 + 20/20   Dist ph  NI        Tonometry (Tonopen, 9:14 AM)      Right Left   Pressure 13 16       Pupils      Pupils Dark Light Shape React APD   Right PERRL 3 2.5 Round Brisk None   Left PERRL 3 3 Round Minimal None       Visual Fields (Counting fingers)      Left Right    Full Full       Neuro/Psych    Oriented x3: Yes   Mood/Affect: Normal       Dilation    Both eyes: 1.0% Mydriacyl, 2.5% Phenylephrine @ 9:14 AM        Slit Lamp and Fundus Exam    External Exam      Right Left   External Normal Normal       Slit Lamp Exam      Right Left   Lids/Lashes Normal Normal   Conjunctiva/Sclera White and quiet White and quiet   Cornea Clear Clear   Anterior Chamber Deep and quiet Deep and quiet   Iris Round and reactive Round and reactive   Lens Centered posterior chamber intraocular lens, 1+ Posterior capsular opacification Centered posterior chamber intraocular lens, 1+ Posterior capsular opacification  Anterior Vitreous Vitrectomized Normal       Fundus Exam      Right Left   Posterior Vitreous Vitrectomized Posterior vitreous detachment, Central vitreous floaters   Disc Normal, myopic type changes Normal, myopic type changes   C/D Ratio 0.55 0.5   Macula Hard drusen, macular hole closed. Hard drusen   Vessels Normal Normal   Periphery Normal Normal          IMAGING AND PROCEDURES  Imaging and Procedures for 02/02/20           ASSESSMENT/PLAN:  No problem-specific Assessment & Plan notes found for this encounter.      ICD-10-CM   1. Macular hole of right eye  H35.341 OCT, Retina - OU - Both Eyes  2. Follow-up examination after eye surgery  Z09     1. Methamphetamine, membrane peel, gas injection OD for macular hole with 20/200 preoperative vision. Acuity is improved now 2050+.  2. Physiologic cupping OU. No signs of glaucoma.  3. History of vitreous detachment OS with floaters.  Ophthalmic Meds Ordered this visit:  No orders of the defined types were placed in this encounter.      No follow-ups on  file.  There are no Patient Instructions on file for this visit.   Explained the diagnoses, plan, and follow up with the patient and they expressed understanding.  Patient expressed understanding of the importance of proper follow up care.   Clent Demark Shiya Fogelman M.D. Diseases & Surgery of the Retina and Vitreous [facility] 02/02/20     Abbreviations: M myopia (nearsighted); A astigmatism; H hyperopia (farsighted); P presbyopia; Mrx spectacle prescription;  CTL contact lenses; OD right eye; OS left eye; OU both eyes  XT exotropia; ET esotropia; PEK punctate epithelial keratitis; PEE punctate epithelial erosions; DES dry eye syndrome; MGD meibomian gland dysfunction; ATs artificial tears; PFAT's preservative free artificial tears; [place] nuclear sclerotic cataract; PSC posterior subcapsular cataract; ERM epi-retinal membrane; PVD posterior vitreous detachment; RD retinal detachment; DM diabetes mellitus; DR diabetic retinopathy; NPDR non-proliferative diabetic retinopathy; PDR proliferative diabetic retinopathy; CSME clinically significant macular edema; DME diabetic macular edema; dbh dot blot hemorrhages; CWS cotton wool spot; POAG primary open angle glaucoma; C/D cup-to-disc ratio; HVF humphrey visual field; GVF goldmann visual field; OCT optical coherence tomography; IOP intraocular pressure; BRVO Branch retinal vein occlusion; CRVO central retinal vein occlusion; CRAO central retinal artery occlusion; BRAO branch retinal artery occlusion; RT retinal tear; SB scleral buckle; PPV pars plana vitrectomy; VH Vitreous hemorrhage; PRP panretinal laser photocoagulation; IVK intravitreal kenalog; VMT vitreomacular traction; MH Macular hole;  NVD neovascularization of the disc; NVE neovascularization elsewhere; AREDS age related eye disease study; ARMD age related macular degeneration; POAG primary open angle glaucoma; EBMD epithelial/anterior basement membrane dystrophy; ACIOL anterior chamber  intraocular lens; IOL intraocular lens; PCIOL posterior chamber intraocular lens; Phaco/IOL phacoemulsification with intraocular lens placement; [place] photorefractive keratectomy; LASIK laser assisted in situ keratomileusis; HTN hypertension; DM diabetes mellitus; COPD chronic obstructive pulmonary disease

## 2020-12-13 LAB — HM MAMMOGRAPHY

## 2021-01-09 ENCOUNTER — Ambulatory Visit: Payer: 59 | Admitting: Family Medicine

## 2021-01-09 ENCOUNTER — Encounter: Payer: Self-pay | Admitting: Family Medicine

## 2021-01-09 ENCOUNTER — Other Ambulatory Visit: Payer: Self-pay

## 2021-01-09 ENCOUNTER — Ambulatory Visit (INDEPENDENT_AMBULATORY_CARE_PROVIDER_SITE_OTHER): Payer: 59

## 2021-01-09 VITALS — BP 140/80 | HR 76 | Temp 98.4°F | Wt 176.5 lb

## 2021-01-09 DIAGNOSIS — M25572 Pain in left ankle and joints of left foot: Secondary | ICD-10-CM

## 2021-01-09 DIAGNOSIS — Z1211 Encounter for screening for malignant neoplasm of colon: Secondary | ICD-10-CM | POA: Diagnosis not present

## 2021-01-09 NOTE — Patient Instructions (Signed)
If ankle x-ray is normal focus on icing 20 minutes 2-3 times daily  Consider trial of OTC Diclofenac (Voltaren gel) 2-3 times daily  If no relief with the above in 2-3 weeks let me know.

## 2021-01-09 NOTE — Progress Notes (Signed)
Established Patient Office Visit  Subjective:  Patient ID: Jacqueline Huang, female    DOB: [birth year]  Age: 65 y.o. MRN: [id number]  CC:  Chief Complaint  Patient presents with  . Ankle Pain    L ankle, x 6 months, no known injury    HPI Jacqueline Huang presents for the following items  Left ankle pain for about 6 months.  She stepped up her walking and distant considerably more walking during that time.  She notices pain medially just somewhat posterior and inferior to the medial malleolus.  No history of injury.  Pain is somewhat worse when she first starts walking gets a little better after she warms up.  Pain is worse with uneven surfaces.  She is not see any visible swelling or redness.  No Achilles tenderness.  She is requesting Cologuard.  No family history of colon cancer.  No prior history of polyps.  Third issue is she has a couple of skin lesions she would like [place] to look at.  Her mother apparently had melanoma.  She thinks these are skin tags in her groin region  Past Medical History:  Diagnosis Date  . Chicken pox     No past surgical history on file.  Family History  Problem Relation Age of Onset  . Cancer Mother        melonma     Social History   Socioeconomic History  . Marital status: Married    Spouse name: Not on file  . Number of children: Not on file  . Years of education: Not on file  . Highest education level: Not on file  Occupational History  . Not on file  Tobacco Use  . Smoking status: Former Research scientist (life sciences)  . Smokeless tobacco: Never Used  Substance and Sexual Activity  . Alcohol use: Not on file  . Drug use: Not on file  . Sexual activity: Not on file  Other Topics Concern  . Not on file  Social History Narrative  . Not on file   Social Determinants of Health   Financial Resource Strain: Not on file  Food Insecurity: Not on file  Transportation Needs: Not on file  Physical Activity: Not on file  Stress: Not on file  Social  Connections: Not on file  Intimate Partner Violence: Not on file    Outpatient Medications Prior to Visit  Medication Sig Dispense Refill  . norethindrone-ethinyl estradiol (FEMHRT 1/5) 1-5 MG-MCG TABS Take by mouth daily.     No facility-administered medications prior to visit.    No Known Allergies  ROS Review of Systems  Neurological: Negative for weakness and numbness.      Objective:    Physical Exam Vitals reviewed.  Constitutional:      Appearance: Normal appearance.  Cardiovascular:     Rate and Rhythm: Normal rate and regular rhythm.  Musculoskeletal:     Comments: Left ankle reveals no effusion.  No erythema.  No warmth.  Good range of motion.  No bony tenderness.  She has some very mild tenderness just inferior and posterior to the medial malleolus.  No Achilles tenderness.  Skin:    Comments: She has a couple of slightly raised well demarcated verrucous lesions which are slightly scaly right upper groin region.  Neurological:     Mental Status: She is alert.     BP 140/80 (BP Location: Left Arm, Patient Position: Sitting, Cuff Size: Normal)   Pulse 76   Temp 98.4 F (36.9  C) (Oral)   Wt 176 lb 8 oz (80.1 kg)   SpO2 97%   BMI 26.06 kg/m  Wt Readings from Last 3 Encounters:  01/09/21 176 lb 8 oz (80.1 kg)  10/10/18 182 lb 3.2 oz (82.6 kg)  07/14/15 170 lb (77.1 kg)     Health Maintenance Due  Topic Date Due  . HIV Screening  Never done  . Hepatitis C Screening  Never done  . COLONOSCOPY (Pts 45-82yr Insurance coverage will need to be confirmed)  08/28/2019  . COVID-19 Vaccine (3 - Booster for Pfizer series) 05/16/2020    There are no preventive care reminders to display for this patient.  No results found for: TSH No results found for: WBC, HGB, HCT, MCV, PLT No results found for: NA, K, CHLORIDE, CO2, GLUCOSE, BUN, CREATININE, BILITOT, ALKPHOS, AST, ALT, PROT, ALBUMIN, CALCIUM, ANIONGAP, EGFR, GFR Lab Results  Component Value Date   CHOL  190 10/10/2018   Lab Results  Component Value Date   HDL 75.60 10/10/2018   Lab Results  Component Value Date   LDLCALC 91 10/10/2018   Lab Results  Component Value Date   TRIG 115.0 10/10/2018   Lab Results  Component Value Date   CHOLHDL 3 10/10/2018   No results found for: HGBA1C    Assessment & Plan:   Problem List Items Addressed This Visit   None   Visit Diagnoses    Left ankle pain, unspecified chronicity    -  Primary   Relevant Orders   DG Ankle Complete Left    Suspect probable tendinitis posterior tibial tendon. -Given duration will go ahead and get x-rays.  We recommend she focus on some icing especially after episodes of walking and consider trial of over-the-counter Voltaren gel 2-3 times daily as needed.  If not improving with the above consider sports medicine referral  -Reassurance regarding benign verrucous appearing lesions right upper thigh  -Patient is requesting Cologuard for further colon cancer screening.  She denies any family history of colon cancer and no prior history of reported polyps.  No orders of the defined types were placed in this encounter.   Follow-up: No follow-ups on file.    BCarolann Littler MD

## 2021-01-24 LAB — COLOGUARD

## 2021-02-02 ENCOUNTER — Encounter (INDEPENDENT_AMBULATORY_CARE_PROVIDER_SITE_OTHER): Payer: 59 | Admitting: Ophthalmology

## 2021-02-09 ENCOUNTER — Ambulatory Visit (INDEPENDENT_AMBULATORY_CARE_PROVIDER_SITE_OTHER): Payer: 59 | Admitting: Ophthalmology

## 2021-02-09 ENCOUNTER — Other Ambulatory Visit: Payer: Self-pay

## 2021-02-09 ENCOUNTER — Encounter (INDEPENDENT_AMBULATORY_CARE_PROVIDER_SITE_OTHER): Payer: Self-pay | Admitting: Ophthalmology

## 2021-02-09 DIAGNOSIS — H26493 Other secondary cataract, bilateral: Secondary | ICD-10-CM | POA: Diagnosis not present

## 2021-02-09 DIAGNOSIS — H35341 Macular cyst, hole, or pseudohole, right eye: Secondary | ICD-10-CM

## 2021-02-09 DIAGNOSIS — Z9889 Other specified postprocedural states: Secondary | ICD-10-CM

## 2021-02-09 DIAGNOSIS — H43812 Vitreous degeneration, left eye: Secondary | ICD-10-CM

## 2021-02-09 NOTE — Assessment & Plan Note (Signed)
No new findings OS 

## 2021-02-09 NOTE — Progress Notes (Signed)
02/09/2021     CHIEF COMPLAINT Patient presents for Retina Follow Up (1 year fu OU and OCT/Pt states VA OU stable since last visit. Pt denies FOL, floaters, or ocular pain OU. /)   HISTORY OF PRESENT ILLNESS: Jacqueline Huang is a 65 y.o. female who presents to the clinic today for:   HPI     Retina Follow Up           Diagnosis: Other   Laterality: both eyes   Onset: 1 year ago   Severity: mild   Duration: 1 year   Course: stable   Comments: 1 year fu OU and OCT Pt states VA OU stable since last visit. Pt denies FOL, floaters, or ocular pain OU.           Comments   History of macular hole with 20/200 preoperative vision now with 20/50 pinhole, no interval change over the last 1 year  Repaired OD in October 2020      Last edited by Edmon Crape, MD on 02/09/2021 10:04 AM.      Referring physician: Kristian Covey, MD [address] [place],  [place] [address]  HISTORICAL INFORMATION:   Selected notes from the MEDICAL RECORD NUMBER       CURRENT MEDICATIONS: No current outpatient medications on file. (Ophthalmic Drugs)   No current facility-administered medications for this visit. (Ophthalmic Drugs)   Current Outpatient Medications (Other)  Medication Sig   norethindrone-ethinyl estradiol (FEMHRT 1/5) 1-5 MG-MCG TABS Take by mouth daily.   No current facility-administered medications for this visit. (Other)      REVIEW OF SYSTEMS:    ALLERGIES No Known Allergies  PAST MEDICAL HISTORY Past Medical History:  Diagnosis Date   Chicken pox    History reviewed. No pertinent surgical history.  FAMILY HISTORY Family History  Problem Relation Age of Onset   Cancer Mother        melonma     SOCIAL HISTORY Social History   Tobacco Use   Smoking status: Former    Pack years: 0.00   Smokeless tobacco: Never         OPHTHALMIC EXAM:  Base Eye Exam     Visual Acuity (ETDRS)       Right Left   Dist [place] 20/70 -1 20/20    Dist ph [place] 20/40 -2          Tonometry (Tonopen, 9:22 AM)       Right Left   Pressure 18 16         Pupils       Pupils Dark Light Shape React APD   Right PERRL 3 2 Round Brisk None   Left PERRL 2 2 Round Minimal None         Visual Fields (Counting fingers)       Left Right    Full Full         Extraocular Movement       Right Left    Full Full         Neuro/Psych     Oriented x3: Yes   Mood/Affect: Normal         Dilation     Both eyes: 1.0% Mydriacyl, 2.5% Phenylephrine @ 9:22 AM           Slit Lamp and Fundus Exam     External Exam       Right Left   External Normal Normal  Slit Lamp Exam       Right Left   Lids/Lashes Normal Normal   Conjunctiva/Sclera White and quiet White and quiet   Cornea Clear Clear   Anterior Chamber Deep and quiet Deep and quiet   Iris Round and reactive Round and reactive   Lens Centered posterior chamber intraocular lens, 1+ Posterior capsular opacification Centered posterior chamber intraocular lens, 1+ Posterior capsular opacification   Anterior Vitreous Vitrectomized Normal         Fundus Exam       Right Left   Posterior Vitreous Vitrectomized Posterior vitreous detachment, Central vitreous floaters   Disc Normal, myopic type changes Normal, myopic type changes   C/D Ratio 0.55 0.5   Macula Hard drusen, macular hole closed. Hard drusen   Vessels Normal Normal   Periphery Normal Normal            IMAGING AND PROCEDURES  Imaging and Procedures for 02/09/21  OCT, Retina - OU - Both Eyes       Right Eye Quality was good. Scan locations included subfoveal. Central Foveal Thickness: 279. Progression has been stable. Findings include abnormal foveal contour, retinal drusen , myopic contour.   Left Eye Quality was good. Scan locations included subfoveal. Central Foveal Thickness: 285. Findings include myopic contour.   Notes History of macular hole repair OD, some diffuse  retinal atrophy temporally. Change over time. Hole is closed. Minor IS-OS disturbance in the macular foveal region             ASSESSMENT/PLAN:  Posterior capsular opacification, bilateral Mild media opacity from central posterior opacification  Posterior vitreous detachment of left eye No new findings OS     ICD-10-CM   1. Macular hole of right eye  H35.341 OCT, Retina - OU - Both Eyes    2. Posterior vitreous detachment of left eye  H43.812 OCT, Retina - OU - Both Eyes    3. History of vitrectomy  Z98.890     4. Posterior capsular opacification, bilateral  H26.493       1.  History of macular hole right eye with 20/200 vision, now improved to 20/50 panel, does probably need refractive assistance, follow-up with general eye care optometry   on a as needed basis here  2.  Mild media opacity from posterior capsule opacification, observe for now  3.  Follow-up with Dr. Vonna Kotyk also as scheduled  Ophthalmic Meds Ordered this visit:  No orders of the defined types were placed in this encounter.      Return if symptoms worsen or fail to improve, for Follow-up with general eye care, optometry.  There are no Patient Instructions on file for this visit.   Explained the diagnoses, plan, and follow up with the patient and they expressed understanding.  Patient expressed understanding of the importance of proper follow up care.   Alford Highland Areeba Sulser M.D. Diseases & Surgery of the Retina and Vitreous [facility] 02/09/21     Abbreviations: M myopia (nearsighted); A astigmatism; H hyperopia (farsighted); P presbyopia; Mrx spectacle prescription;  CTL contact lenses; OD right eye; OS left eye; OU both eyes  XT exotropia; ET esotropia; PEK punctate epithelial keratitis; PEE punctate epithelial erosions; DES dry eye syndrome; MGD meibomian gland dysfunction; ATs artificial tears; PFAT's preservative free artificial tears; NSC nuclear sclerotic cataract; PSC  posterior subcapsular cataract; ERM epi-retinal membrane; PVD posterior vitreous detachment; RD retinal detachment; DM diabetes mellitus; DR diabetic retinopathy; NPDR non-proliferative diabetic retinopathy; PDR proliferative  diabetic retinopathy; CSME clinically significant macular edema; DME diabetic macular edema; dbh dot blot hemorrhages; CWS cotton wool spot; POAG primary open angle glaucoma; C/D cup-to-disc ratio; HVF humphrey visual field; GVF goldmann visual field; OCT optical coherence tomography; IOP intraocular pressure; BRVO Branch retinal vein occlusion; CRVO central retinal vein occlusion; CRAO central retinal artery occlusion; BRAO branch retinal artery occlusion; RT retinal tear; SB scleral buckle; PPV pars plana vitrectomy; VH Vitreous hemorrhage; PRP panretinal laser photocoagulation; IVK intravitreal kenalog; VMT vitreomacular traction; MH Macular hole;  NVD neovascularization of the disc; NVE neovascularization elsewhere; AREDS age related eye disease study; ARMD age related macular degeneration; POAG primary open angle glaucoma; EBMD epithelial/anterior basement membrane dystrophy; ACIOL anterior chamber intraocular lens; IOL intraocular lens; PCIOL posterior chamber intraocular lens; Phaco/IOL phacoemulsification with intraocular lens placement; [place] photorefractive keratectomy; LASIK laser assisted in situ keratomileusis; HTN hypertension; DM diabetes mellitus; COPD chronic obstructive pulmonary disease

## 2021-02-09 NOTE — Assessment & Plan Note (Signed)
Mild media opacity from central posterior opacification

## 2021-04-25 ENCOUNTER — Other Ambulatory Visit: Payer: Self-pay

## 2021-04-25 ENCOUNTER — Encounter: Payer: Self-pay | Admitting: Family Medicine

## 2021-04-25 ENCOUNTER — Ambulatory Visit: Payer: 59 | Admitting: Family Medicine

## 2021-04-25 VITALS — BP 150/80 | HR 82 | Temp 98.4°F | Wt 178.2 lb

## 2021-04-25 DIAGNOSIS — M19072 Primary osteoarthritis, left ankle and foot: Secondary | ICD-10-CM | POA: Diagnosis not present

## 2021-04-25 NOTE — Progress Notes (Signed)
Established Patient Office Visit  Subjective:  Patient ID: Jacqueline Huang, female    DOB: Jun 24, 1956  Age: 65 y.o. MRN: [id number]  CC:  Chief Complaint  Patient presents with   Follow-up    Follow up on ankle, no improvement, pt would like a referral    HPI Jacqueline Huang presents for persistent left ankle pain.  She was seen in May and complained of some medial left ankle pain of at least 6 months duration..  No visible swelling.  We obtained x-rays which showed some significant degenerative changes of the medial aspect of the ankle.  No acute bony abnormalities.  She has never noted any redness or warmth.  Denies any injury.  She tried icing and topical diclofenac without any significant improvement.  She would like to consult with orthopedic specialist at this time.  Past Medical History:  Diagnosis Date   Chicken pox     No past surgical history on file.  Family History  Problem Relation Age of Onset   Cancer Mother        melonma     Social History   Socioeconomic History   Marital status: Married    Spouse name: Not on file   Number of children: Not on file   Years of education: Not on file   Highest education level: Not on file  Occupational History   Not on file  Tobacco Use   Smoking status: Former   Smokeless tobacco: Never  Substance and Sexual Activity   Alcohol use: Not on file   Drug use: Not on file   Sexual activity: Not on file  Other Topics Concern   Not on file  Social History Narrative   Not on file   Social Determinants of Health   Financial Resource Strain: Not on file  Food Insecurity: Not on file  Transportation Needs: Not on file  Physical Activity: Not on file  Stress: Not on file  Social Connections: Not on file  Intimate Partner Violence: Not on file    Outpatient Medications Prior to Visit  Medication Sig Dispense Refill   norethindrone-ethinyl estradiol (FEMHRT 1/5) 1-5 MG-MCG TABS Take by mouth daily.     No  facility-administered medications prior to visit.    No Known Allergies  ROS Review of Systems  Constitutional:  Negative for chills and fever.  Musculoskeletal:  Positive for arthralgias.  Neurological:  Negative for weakness.     Objective:    Physical Exam Vitals reviewed.  Constitutional:      Appearance: Normal appearance.  Cardiovascular:     Rate and Rhythm: Normal rate.  Musculoskeletal:     Comments: Left ankle reveals no visible swelling.  No erythema.  No warmth.  No ecchymosis.  She has some mild tenderness medially.  Fairly good range of motion.  No Achilles tenderness.  No bony tenderness in the foot.  Neurological:     Mental Status: She is alert.    BP (!) 150/80 (BP Location: Left Arm, Patient Position: Sitting, Cuff Size: Normal)   Pulse 82   Temp 98.4 F (36.9 C) (Oral)   Wt 178 lb 3.2 oz (80.8 kg)   SpO2 98%   BMI 26.32 kg/m  Wt Readings from Last 3 Encounters:  04/25/21 178 lb 3.2 oz (80.8 kg)  01/09/21 176 lb 8 oz (80.1 kg)  10/10/18 182 lb 3.2 oz (82.6 kg)     Health Maintenance Due  Topic Date Due   HIV Screening  Never done   Hepatitis C Screening  Never done   Zoster Vaccines- Shingrix (1 of 2) Never done   COLONOSCOPY (Pts 45-43yr Insurance coverage will need to be confirmed)  08/28/2019   COVID-19 Vaccine (3 - Booster for Pfizer series) 05/16/2020   INFLUENZA VACCINE  03/27/2021    There are no preventive care reminders to display for this patient.  No results found for: TSH No results found for: WBC, HGB, HCT, MCV, PLT No results found for: NA, K, CHLORIDE, CO2, GLUCOSE, BUN, CREATININE, BILITOT, ALKPHOS, AST, ALT, PROT, ALBUMIN, CALCIUM, ANIONGAP, EGFR, GFR Lab Results  Component Value Date   CHOL 190 10/10/2018   Lab Results  Component Value Date   HDL 75.60 10/10/2018   Lab Results  Component Value Date   LDLCALC 91 10/10/2018   Lab Results  Component Value Date   TRIG 115.0 10/10/2018   Lab Results  Component  Value Date   CHOLHDL 3 10/10/2018   No results found for: HGBA1C    Assessment & Plan:   Several month history of persistent left ankle pain.  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  Recent x-rays show degenerative changes mostly along the medial aspect.  She has tried conservative things with topical nonsteroidals and icing without much improvement  -Set up orthopedic referral    Follow-up: No follow-ups on file.    BCarolann Littler MD

## 2021-10-04 ENCOUNTER — Ambulatory Visit: Payer: 59 | Admitting: Family Medicine

## 2021-10-04 VITALS — BP 130/84 | HR 98 | Temp 97.6°F | Wt 184.1 lb

## 2021-10-04 DIAGNOSIS — R142 Eructation: Secondary | ICD-10-CM | POA: Diagnosis not present

## 2021-10-04 DIAGNOSIS — R072 Precordial pain: Secondary | ICD-10-CM | POA: Diagnosis not present

## 2021-10-04 NOTE — Patient Instructions (Signed)
Try OTC Nexium or Prilosec 20 mg twice daily for the next week or two.     Please let me know if symptoms not improving with that  Leave off the Meloxicam or any other NSAID

## 2021-10-04 NOTE — Progress Notes (Signed)
Established Patient Office Visit  Subjective:  Patient ID: Jacqueline Huang, female    DOB: 1955-10-14  Age: 66 y.o. MRN: [id number]  CC:  Chief Complaint  Patient presents with   Heartburn    X 1 week, constant, recently prescribed meloxicam, stopped med 1 week ago    HPI Jacqueline Huang presents for 1 week history of "heartburn ".  She states she has had some low-grade constant substernal pressure-like sensation and frequent belching.  Occasional burning sensation.  Had recently been take some meloxicam but stopped this about a week ago.  No melena.  No dysphagia.  Decreased appetite past couple days but no recent weight loss issues.  No exertional symptoms.  Symptoms seem to be better when she is laying flat.  She took some Tums which may have helped slightly.  Does not have any family history of premature CAD.  She quit smoking years ago.  No history of diabetes.  No right upper quadrant pain.  No radiation.  No recent stool changes.  The 10-year ASCVD risk score (Arnett DK, et al., 2019) is: 5%   Values used to calculate the score:     Age: 25 years     Sex: Female     Is Non-Hispanic African American: No     Diabetic: No     Tobacco smoker: No     Systolic Blood Pressure: 831 mmHg     Is BP treated: No     HDL Cholesterol: 75.6 mg/dL     Total Cholesterol: 190 mg/dL  Wt Readings from Last 3 Encounters:  10/04/21 184 lb 1.6 oz (83.5 kg)  04/25/21 178 lb 3.2 oz (80.8 kg)  01/09/21 176 lb 8 oz (80.1 kg)     Past Medical History:  Diagnosis Date   Chicken pox     No past surgical history on file.  Family History  Problem Relation Age of Onset   Cancer Mother        melonma     Social History   Socioeconomic History   Marital status: Married    Spouse name: Not on file   Number of children: Not on file   Years of education: Not on file   Highest education level: Not on file  Occupational History   Not on file  Tobacco Use   Smoking status: Former    Smokeless tobacco: Never  Substance and Sexual Activity   Alcohol use: Not on file   Drug use: Not on file   Sexual activity: Not on file  Other Topics Concern   Not on file  Social History Narrative   Not on file   Social Determinants of Health   Financial Resource Strain: Not on file  Food Insecurity: Not on file  Transportation Needs: Not on file  Physical Activity: Not on file  Stress: Not on file  Social Connections: Not on file  Intimate Partner Violence: Not on file    Outpatient Medications Prior to Visit  Medication Sig Dispense Refill   norethindrone-ethinyl estradiol (FEMHRT 1/5) 1-5 MG-MCG TABS Take by mouth daily.     No facility-administered medications prior to visit.    No Known Allergies  ROS Review of Systems  Constitutional:  Negative for unexpected weight change.  Respiratory:  Negative for cough and shortness of breath.   Cardiovascular:  Negative for palpitations.  Gastrointestinal:  Negative for nausea and vomiting.  Genitourinary:  Negative for dysuria.  Neurological:  Negative for dizziness.  Objective:    Physical Exam Vitals reviewed.  Constitutional:      General: She is not in acute distress.    Appearance: Normal appearance. She is not ill-appearing or diaphoretic.  Cardiovascular:     Rate and Rhythm: Normal rate and regular rhythm.     Heart sounds: No murmur heard.   No gallop.  Pulmonary:     Effort: Pulmonary effort is normal.     Breath sounds: Normal breath sounds. No wheezing or rales.  Abdominal:     General: Bowel sounds are normal. There is no distension.     Palpations: Abdomen is soft. There is no mass.     Tenderness: There is no abdominal tenderness. There is no guarding or rebound.  Neurological:     Mental Status: She is alert.    BP (!) 150/84 (BP Location: Left Arm, Patient Position: Sitting, Cuff Size: Normal)    Pulse 98    Temp 97.6 F (36.4 C) (Oral)    Wt 184 lb 1.6 oz (83.5 kg)    SpO2 98%    BMI  27.19 kg/m  Wt Readings from Last 3 Encounters:  10/04/21 184 lb 1.6 oz (83.5 kg)  04/25/21 178 lb 3.2 oz (80.8 kg)  01/09/21 176 lb 8 oz (80.1 kg)     Health Maintenance Due  Topic Date Due   HIV Screening  Never done   Hepatitis C Screening  Never done   Zoster Vaccines- Shingrix (1 of 2) Never done   COLONOSCOPY (Pts 45-15yr Insurance coverage will need to be confirmed)  08/28/2019   COVID-19 Vaccine (3 - Booster for Pfizer series) 02/09/2020   INFLUENZA VACCINE  Never done   PAP SMEAR-Modifier  08/27/2021    There are no preventive care reminders to display for this patient.  No results found for: TSH No results found for: WBC, HGB, HCT, MCV, PLT No results found for: NA, K, CHLORIDE, CO2, GLUCOSE, BUN, CREATININE, BILITOT, ALKPHOS, AST, ALT, PROT, ALBUMIN, CALCIUM, ANIONGAP, EGFR, GFR Lab Results  Component Value Date   CHOL 190 10/10/2018   Lab Results  Component Value Date   HDL 75.60 10/10/2018   Lab Results  Component Value Date   LDLCALC 91 10/10/2018   Lab Results  Component Value Date   TRIG 115.0 10/10/2018   Lab Results  Component Value Date   CHOLHDL 3 10/10/2018   No results found for: HGBA1C    Assessment & Plan:   Problem List Items Addressed This Visit   None Visit Diagnoses     Substernal pain    -  Primary   Relevant Orders   EKG 12-Lead   Belching         Patient relates 1 week history of low-grade relatively constant substernal pain and burning along with increased belching.  Doubt cardiac.  She does not have any exertional symptoms.  -We will obtain EKG to make sure no acute changes. -Continue to hold meloxicam and any other non-steroidals  -EKG shows sinus rhythm with no acute ST-T changes.  -In addition to dietary changes and avoidance of nonsteroidals would recommend she try over-the-counter Prilosec or Nexium 20 mg twice daily and be in touch if symptoms not improving significantly over the next week.  Follow-up  immediately for any exertional chest pain, diaphoresis, or any other new symptoms  No orders of the defined types were placed in this encounter.   Follow-up: No follow-ups on file.    BCarolann Littler MD

## 2021-11-14 ENCOUNTER — Ambulatory Visit: Payer: 59 | Admitting: Family Medicine

## 2021-11-14 ENCOUNTER — Encounter: Payer: Self-pay | Admitting: Family Medicine

## 2021-11-14 VITALS — BP 132/80 | HR 95 | Temp 97.5°F | Ht 69.0 in | Wt 180.5 lb

## 2021-11-14 DIAGNOSIS — R1013 Epigastric pain: Secondary | ICD-10-CM

## 2021-11-14 LAB — COMPREHENSIVE METABOLIC PANEL
ALT: 10 U/L (ref 0–35)
AST: 16 U/L (ref 0–37)
Albumin: 4.4 g/dL (ref 3.5–5.2)
Alkaline Phosphatase: 63 U/L (ref 39–117)
BUN: 15 mg/dL (ref 6–23)
CO2: 30 mEq/L (ref 19–32)
Calcium: 9.4 mg/dL (ref 8.4–10.5)
Chloride: 105 mEq/L (ref 96–112)
Creatinine, Ser: 0.72 mg/dL (ref 0.40–1.20)
GFR: 88.69 mL/min (ref >60.00)
Glucose, Bld: 90 mg/dL (ref 70–99)
Potassium: 3.6 mEq/L (ref 3.5–5.1)
Sodium: 141 mEq/L (ref 135–145)
Total Bilirubin: 0.9 mg/dL (ref 0.2–1.2)
Total Protein: 6.6 g/dL (ref 6.0–8.3)

## 2021-11-14 LAB — CBC WITH DIFFERENTIAL/PLATELET
Basophils Absolute: 0 10*3/uL (ref 0.0–0.1)
Basophils Relative: 0.5 % (ref 0.0–3.0)
Eosinophils Absolute: 0 10*3/uL (ref 0.0–0.7)
Eosinophils Relative: 0.8 % (ref 0.0–5.0)
HCT: 39.9 % (ref 36.0–46.0)
Hemoglobin: 13.6 g/dL (ref 12.0–15.0)
Lymphocytes Relative: 36.1 % (ref 12.0–46.0)
Lymphs Abs: 1.9 10*3/uL (ref 0.7–4.0)
MCHC: 34 g/dL (ref 30.0–36.0)
MCV: 97.8 fl (ref 78.0–100.0)
Monocytes Absolute: 0.4 10*3/uL (ref 0.1–1.0)
Monocytes Relative: 7.9 % (ref 3.0–12.0)
Neutro Abs: 2.8 10*3/uL (ref 1.4–7.7)
Neutrophils Relative %: 54.7 % (ref 43.0–77.0)
Platelets: 245 10*3/uL (ref 150.0–400.0)
RBC: 4.08 Mil/uL (ref 3.87–5.11)
RDW: 12.3 % (ref 11.5–15.5)
WBC: 5.2 10*3/uL (ref 4.0–10.5)

## 2021-11-14 LAB — LIPASE: Lipase: 43 U/L (ref 11.0–59.0)

## 2021-11-14 MED ORDER — PANTOPRAZOLE SODIUM 40 MG PO TBEC: 40.0000 mg | DELAYED_RELEASE_TABLET | Freq: Every day | ORAL | 1 refills | Status: DC

## 2021-11-14 NOTE — Progress Notes (Signed)
? ?Established Patient Office Visit ? ?Subjective:  ?Patient ID: Jacqueline Huang, female    DOB: August 10, 1956  Age: 66 y.o. MRN: [id number] ? ?CC:  ?Chief Complaint  ?Patient presents with  ? Gastroesophageal Reflux  ? ? ?HPI ?Jacqueline Huang presents for follow-up from last visit.  Seen here February 8 with some heartburn type symptoms with burning substernally and frequent belching.  No exertional symptoms.  Had been taking meloxicam regularly.  No melena.  No dysphagia.  Slight decrease in appetite.  Tums that helped slightly.  EKG was unremarkable. ? ?She did take over-the-counter Prilosec 20 mg daily for couple weeks and felt somewhat better but symptoms never fully resolved.  She has lost couple pounds with she thinks some of this is her efforts at making some dietary changes.  She has given up things like citric acid and high fat foods.  No dysphagia.  No exertional chest pain.  Appetite is fair.  No pain with swallowing.  No right upper quadrant pain.  No radiation.  She has some mild discomfort which is more upper substernal region and related to belching ? ?Past Medical History:  ?Diagnosis Date  ? Chicken pox   ? ? ?History reviewed. No pertinent surgical history. ? ?Family History  ?Problem Relation Age of Onset  ? Cancer Mother   ?     melonma   ? ? ?Social History  ? ?Socioeconomic History  ? Marital status: Married  ?  Spouse name: Not on file  ? Number of children: Not on file  ? Years of education: Not on file  ? Highest education level: Not on file  ?Occupational History  ? Not on file  ?Tobacco Use  ? Smoking status: Former  ? Smokeless tobacco: Never  ?Substance and Sexual Activity  ? Alcohol use: Not on file  ? Drug use: Not on file  ? Sexual activity: Not on file  ?Other Topics Concern  ? Not on file  ?Social History Narrative  ? Not on file  ? ?Social Determinants of Health  ? ?Financial Resource Strain: Not on file  ?Food Insecurity: Not on file  ?Transportation Needs: Not on file   ?Physical Activity: Not on file  ?Stress: Not on file  ?Social Connections: Not on file  ?Intimate Partner Violence: Not on file  ? ? ?Outpatient Medications Prior to Visit  ?Medication Sig Dispense Refill  ? norethindrone-ethinyl estradiol (FEMHRT 1/5) 1-5 MG-MCG TABS Take by mouth daily.    ? ?No facility-administered medications prior to visit.  ? ? ?No Known Allergies ? ?ROS ?Review of Systems  ?Constitutional:  Negative for chills and fever.  ?Respiratory:  Negative for cough and shortness of breath.   ?Gastrointestinal:  Positive for abdominal pain. Negative for abdominal distention, blood in stool, constipation, diarrhea, nausea and vomiting.  ? ?  ?Objective:  ?  ?Physical Exam ?Vitals reviewed.  ?Constitutional:   ?   Appearance: Normal appearance.  ?Cardiovascular:  ?   Rate and Rhythm: Normal rate and regular rhythm.  ?Pulmonary:  ?   Effort: Pulmonary effort is normal.  ?   Breath sounds: Normal breath sounds. No wheezing or rales.  ?Abdominal:  ?   General: Bowel sounds are normal.  ?   Palpations: Abdomen is soft. There is no mass.  ?   Tenderness: There is no abdominal tenderness. There is no guarding or rebound.  ?Musculoskeletal:  ?   Cervical back: Neck supple.  ?Lymphadenopathy:  ?   Cervical: No  cervical adenopathy.  ?Neurological:  ?   Mental Status: She is alert.  ? ? ?BP 132/80 (BP Location: Left Arm, Patient Position: Sitting, Cuff Size: Normal)   Pulse 95   Temp (!) 97.5 ?F (36.4 ?C) (Oral)   Ht 5' 9"  (1.753 m)   Wt 180 lb 8 oz (81.9 kg)   SpO2 96%   BMI 26.66 kg/m?  ?Wt Readings from Last 3 Encounters:  ?11/14/21 180 lb 8 oz (81.9 kg)  ?10/04/21 184 lb 1.6 oz (83.5 kg)  ?04/25/21 178 lb 3.2 oz (80.8 kg)  ? ? ? ?Health Maintenance Due  ?Topic Date Due  ? HIV Screening  Never done  ? Hepatitis C Screening  Never done  ? Zoster Vaccines- Shingrix (1 of 2) Never done  ? COLONOSCOPY (Pts 45-2yr Insurance coverage will need to be confirmed)  08/28/2019  ? COVID-19 Vaccine (3 - Booster  for Pfizer series) 02/09/2020  ? INFLUENZA VACCINE  Never done  ? PAP SMEAR-Modifier  08/27/2021  ? ? ?There are no preventive care reminders to display for this patient. ? ?No results found for: TSH ?No results found for: WBC, HGB, HCT, MCV, PLT ?No results found for: NA, K, CHLORIDE, CO2, GLUCOSE, BUN, CREATININE, BILITOT, ALKPHOS, AST, ALT, PROT, ALBUMIN, CALCIUM, ANIONGAP, EGFR, GFR ?Lab Results  ?Component Value Date  ? CHOL 190 10/10/2018  ? ?Lab Results  ?Component Value Date  ? HDL 75.60 10/10/2018  ? ?Lab Results  ?Component Value Date  ? [facility] 10/10/2018  ? ?Lab Results  ?Component Value Date  ? TRIG 115.0 10/10/2018  ? ?Lab Results  ?Component Value Date  ? CHOLHDL 3 10/10/2018  ? ?No results found for: HGBA1C ? ?  ?Assessment & Plan:  ? ?Problem List Items Addressed This Visit   ?None ?Visit Diagnoses   ? ? Abdominal pain, epigastric    -  Primary  ? Relevant Orders  ? CBC with Differential/Platelet  ? CMP  ? Lipase  ? ?  ?Patient relates little over 1 month history of some substernal burning intermittently which did improve slightly after taking PPI but she only took this for about 2 weeks.  Does not have any dysphagia or odynophagia. ? ?-Continue to avoid non-steroidals ?-Check CBC, CMP, lipase ?-Protonix 40 mg once daily ?-Continue dietary modifications as previously discussed. ?-Be in touch if symptoms not substantially improved in 2 weeks ?-Consider GI referral versus upper GI barium swallow in 2 to 3 weeks if not further improved ? ?Meds ordered this encounter  ?Medications  ? pantoprazole (PROTONIX) 40 MG tablet  ?  Sig: Take 1 tablet (40 mg total) by mouth daily.  ?  Dispense:  30 tablet  ?  Refill:  1  ? ? ?Follow-up: No follow-ups on file.  ? ? ?BCarolann Littler MD ?

## 2021-11-14 NOTE — Patient Instructions (Signed)
Start the Protonix 40 mg daily ? ?If discomfort not improved in 2-3 weeks let me know.   ?

## 2022-01-09 ENCOUNTER — Other Ambulatory Visit: Payer: Self-pay | Admitting: Family Medicine

## 2022-03-11 ENCOUNTER — Other Ambulatory Visit: Payer: Self-pay | Admitting: Family Medicine

## 2022-05-07 ENCOUNTER — Telehealth: Payer: Self-pay | Admitting: Family Medicine

## 2022-05-07 DIAGNOSIS — M19072 Primary osteoarthritis, left ankle and foot: Secondary | ICD-10-CM

## 2022-05-07 NOTE — Telephone Encounter (Signed)
I spoke with the patient and she states her insurance company is requiring referral since it has been longer than 6 months since previous referral

## 2022-05-07 NOTE — Telephone Encounter (Signed)
Pt called to request another referral for [facility].   Pt already has an appointment there on 05/10/22.  This will be her second visit for the cortisone shot.  Please advise. [phone]

## 2022-05-08 NOTE — Telephone Encounter (Signed)
Referral placed and patient aware 

## 2022-05-08 NOTE — Telephone Encounter (Addendum)
Pt is calling and the referral needs to go to [facility] please call [phone]. Pt would like mykal to return her call

## 2022-05-08 NOTE — Addendum Note (Signed)
Addended by: Christy Sartorius on: 05/08/2022 11:46 AM   Modules accepted: Orders

## 2022-05-10 NOTE — Telephone Encounter (Signed)
Pt calling to see if this has called back to see if [facility] has been contacted regarding this referral and requests a call to confirm

## 2022-11-01 DIAGNOSIS — M19072 Primary osteoarthritis, left ankle and foot: Secondary | ICD-10-CM | POA: Diagnosis not present

## 2022-11-16 ENCOUNTER — Ambulatory Visit: Payer: 59 | Admitting: Family Medicine

## 2022-11-16 ENCOUNTER — Encounter: Payer: Self-pay | Admitting: Family Medicine

## 2022-11-16 VITALS — BP 118/78 | HR 94 | Ht 69.0 in | Wt 185.7 lb

## 2022-11-16 DIAGNOSIS — L821 Other seborrheic keratosis: Secondary | ICD-10-CM | POA: Diagnosis not present

## 2022-11-16 DIAGNOSIS — R030 Elevated blood-pressure reading, without diagnosis of hypertension: Secondary | ICD-10-CM

## 2022-11-16 NOTE — Progress Notes (Unsigned)
   Established Patient Office Visit  Subjective   Patient ID: Jacqueline Huang, female    DOB: 1955/10/10  Age: 67 y.o. MRN: [id number]  No chief complaint on file.   HPI  {History (Optional):23778} Janeth is seen with skin lesion in her right upper eyebrow or which was first noted a couple months ago.  Grew fairly quickly in size.  Brownish and well-demarcated border.  She had no personal history of skin cancer but her mother did have melanoma history.  She also has several skin tags around her neck region.  He is conscious about sun protection.  Initial blood pressure is up slightly today but she does not have any history of hypertension.  This came down significantly after rest.  Past Medical History:  Diagnosis Date   Chicken pox    No past surgical history on file.  reports that she has quit smoking. She has never used smokeless tobacco. No history on file for alcohol use and drug use. family history includes Cancer in her mother. No Known Allergies  ROS    Objective:     BP 118/78 (BP Location: Left Arm, Cuff Size: Normal)   Pulse 94   Ht 5\' 9"  (1.753 m)   Wt 185 lb 11.2 oz (84.2 kg)   SpO2 97%   BMI 27.42 kg/m  {Vitals History (Optional):23777}  Physical Exam Vitals reviewed.  Constitutional:      General: She is not in acute distress. Cardiovascular:     Rate and Rhythm: Normal rate and regular rhythm.  Pulmonary:     Effort: Pulmonary effort is normal.     Breath sounds: Normal breath sounds. No wheezing or rales.  Skin:    Comments: Brownish well-demarcated approximately 8 mm keratosis lesion right upper eyebrow  Neurological:     Mental Status: She is alert.      No results found for any visits on 11/16/22.  {Labs (Optional):23779}  The ASCVD Risk score (Arnett DK, et al., 2019) failed to calculate for the following reasons:   Cannot find a previous HDL lab   Cannot find a previous total cholesterol lab    Assessment & Plan:   Benign  appearing seborrheic keratosis right eyebrow  -She would like treatment.  We discussed options including liquid nitrogen.  Discussed risk including pain, blistering, low risk of infection.  Patient consented.  We treated with liquid nitrogen she tolerated well. -If not fully resolving in the next 2 to 3 weeks recommend dermatology referral for further evaluation but this has benign characteristics   Carolann Littler, MD

## 2023-01-02 DIAGNOSIS — Z01411 Encounter for gynecological examination (general) (routine) with abnormal findings: Secondary | ICD-10-CM | POA: Diagnosis not present

## 2023-01-02 DIAGNOSIS — Z1231 Encounter for screening mammogram for malignant neoplasm of breast: Secondary | ICD-10-CM | POA: Diagnosis not present

## 2023-01-02 DIAGNOSIS — Z124 Encounter for screening for malignant neoplasm of cervix: Secondary | ICD-10-CM | POA: Diagnosis not present

## 2023-01-02 DIAGNOSIS — Z01419 Encounter for gynecological examination (general) (routine) without abnormal findings: Secondary | ICD-10-CM | POA: Diagnosis not present

## 2023-01-03 LAB — HM MAMMOGRAPHY

## 2023-03-07 DIAGNOSIS — H40013 Open angle with borderline findings, low risk, bilateral: Secondary | ICD-10-CM | POA: Diagnosis not present

## 2023-03-07 DIAGNOSIS — Z961 Presence of intraocular lens: Secondary | ICD-10-CM | POA: Diagnosis not present

## 2023-03-07 DIAGNOSIS — H26493 Other secondary cataract, bilateral: Secondary | ICD-10-CM | POA: Diagnosis not present

## 2023-03-07 DIAGNOSIS — H18413 Arcus senilis, bilateral: Secondary | ICD-10-CM | POA: Diagnosis not present

## 2023-03-07 DIAGNOSIS — H26492 Other secondary cataract, left eye: Secondary | ICD-10-CM | POA: Diagnosis not present

## 2023-03-14 DIAGNOSIS — H26492 Other secondary cataract, left eye: Secondary | ICD-10-CM | POA: Diagnosis not present

## 2023-04-02 DIAGNOSIS — H26491 Other secondary cataract, right eye: Secondary | ICD-10-CM | POA: Diagnosis not present

## 2023-05-11 IMAGING — DX DG ANKLE COMPLETE 3+V*L*
3 series · 3 of 3 positions shown · non-contrast
Comparison: 07/14/2015

CLINICAL DATA: Left ankle pain for 6 months at the medial
malleolus.

EXAM:
LEFT ANKLE COMPLETE - 3+ VIEW

[ankle ap]
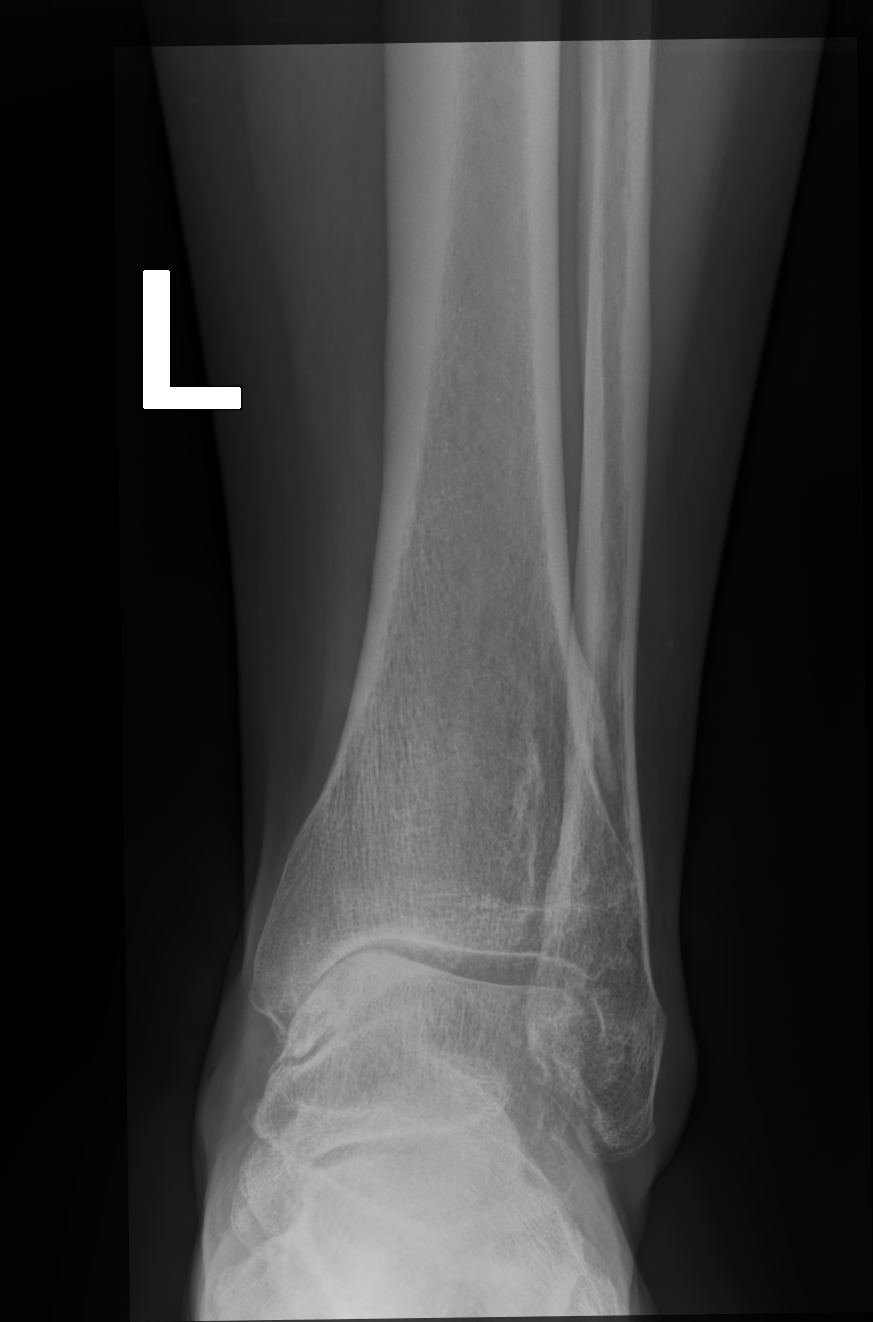

[ankle mlo]
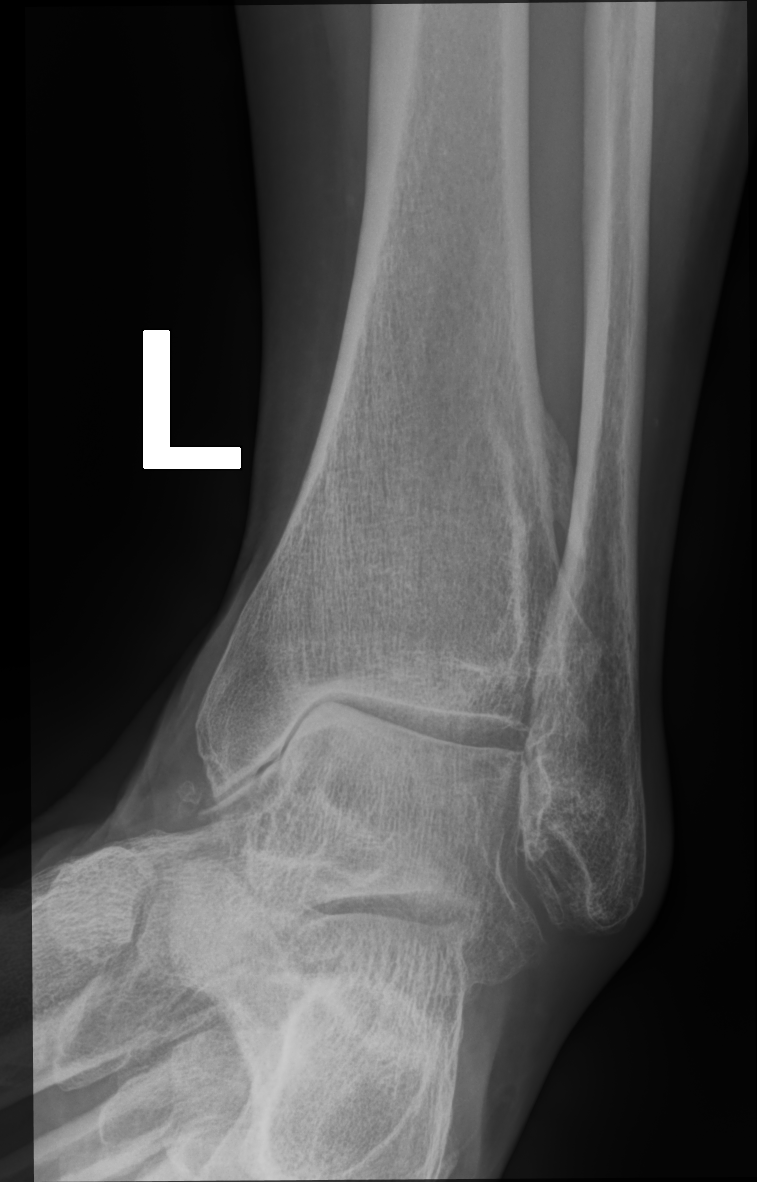

[ankle lat]
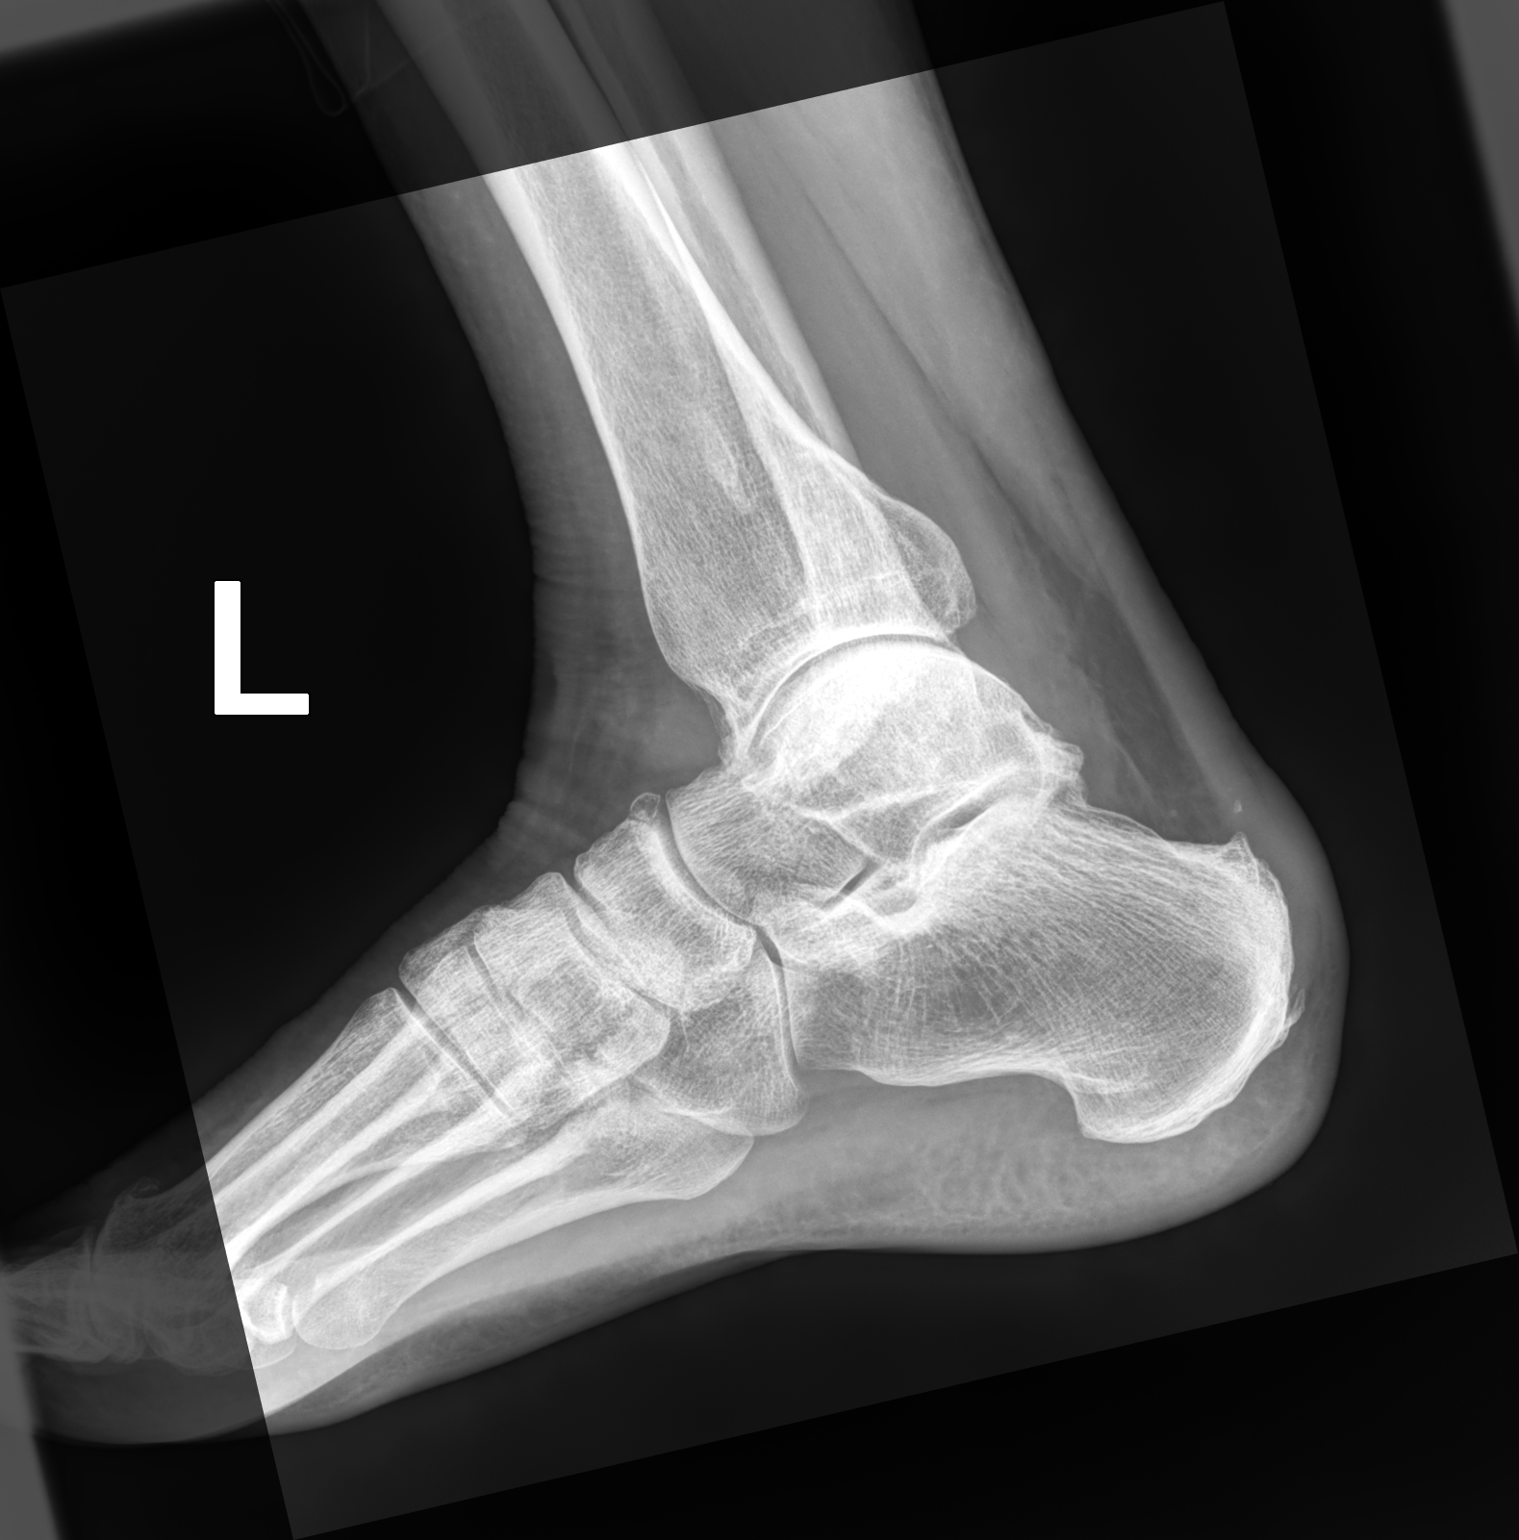

[3 of 3 positions shown; findings below may reference images not displayed]

FINDINGS: There may be some inversion at the left ankle. Joint space narrowing
and subchondral sclerosis involving the medial aspect of the ankle
joint. Negative for acute fracture or dislocation. Mild plantar
spurring along the Achilles tendon insertion site. Soft tissue
density in the anterior ankle could represent an effusion.
IMPRESSION: 1. No acute bone abnormality to the left ankle.
2. Joint space narrowing and degenerative changes along the medial
aspect of the left ankle joint. Probable anterior joint effusion.

## 2023-05-24 DIAGNOSIS — M19072 Primary osteoarthritis, left ankle and foot: Secondary | ICD-10-CM | POA: Diagnosis not present

## 2023-06-04 DIAGNOSIS — N950 Postmenopausal bleeding: Secondary | ICD-10-CM | POA: Diagnosis not present

## 2024-01-22 DIAGNOSIS — Z1331 Encounter for screening for depression: Secondary | ICD-10-CM | POA: Diagnosis not present

## 2024-01-22 DIAGNOSIS — Z1231 Encounter for screening mammogram for malignant neoplasm of breast: Secondary | ICD-10-CM | POA: Diagnosis not present

## 2024-01-22 DIAGNOSIS — N951 Menopausal and female climacteric states: Secondary | ICD-10-CM | POA: Diagnosis not present

## 2024-01-22 DIAGNOSIS — Z01411 Encounter for gynecological examination (general) (routine) with abnormal findings: Secondary | ICD-10-CM | POA: Diagnosis not present

## 2024-01-22 LAB — HM MAMMOGRAPHY

## 2024-01-29 DIAGNOSIS — Z13220 Encounter for screening for lipoid disorders: Secondary | ICD-10-CM | POA: Diagnosis not present

## 2024-01-29 DIAGNOSIS — Z131 Encounter for screening for diabetes mellitus: Secondary | ICD-10-CM | POA: Diagnosis not present

## 2024-01-29 DIAGNOSIS — Z1329 Encounter for screening for other suspected endocrine disorder: Secondary | ICD-10-CM | POA: Diagnosis not present

## 2024-01-29 DIAGNOSIS — Z0000 Encounter for general adult medical examination without abnormal findings: Secondary | ICD-10-CM | POA: Diagnosis not present

## 2024-01-30 DIAGNOSIS — E2839 Other primary ovarian failure: Secondary | ICD-10-CM | POA: Diagnosis not present

## 2024-04-03 DIAGNOSIS — Z1211 Encounter for screening for malignant neoplasm of colon: Secondary | ICD-10-CM | POA: Diagnosis not present
